# Patient Record
Sex: Male | Born: 1950 | Race: White | Hispanic: No | Marital: Married | Smoking: Never smoker
Health system: Southern US, Community
[De-identification: ages and names within clinical notes are randomized; demographics above are authoritative.]

## PROBLEM LIST (undated history)

## (undated) DIAGNOSIS — R931 Abnormal findings on diagnostic imaging of heart and coronary circulation: Secondary | ICD-10-CM

## (undated) DIAGNOSIS — I712 Thoracic aortic aneurysm, without rupture, unspecified: Secondary | ICD-10-CM

## (undated) DIAGNOSIS — G4730 Sleep apnea, unspecified: Secondary | ICD-10-CM

## (undated) DIAGNOSIS — I951 Orthostatic hypotension: Secondary | ICD-10-CM

## (undated) DIAGNOSIS — M3130 Wegener's granulomatosis without renal involvement: Secondary | ICD-10-CM

## (undated) DIAGNOSIS — I77810 Thoracic aortic ectasia: Secondary | ICD-10-CM

## (undated) DIAGNOSIS — M1990 Unspecified osteoarthritis, unspecified site: Secondary | ICD-10-CM

## (undated) DIAGNOSIS — I351 Nonrheumatic aortic (valve) insufficiency: Secondary | ICD-10-CM

## (undated) DIAGNOSIS — I700 Atherosclerosis of aorta: Secondary | ICD-10-CM

## (undated) DIAGNOSIS — I10 Essential (primary) hypertension: Secondary | ICD-10-CM

## (undated) DIAGNOSIS — E785 Hyperlipidemia, unspecified: Secondary | ICD-10-CM

## (undated) HISTORY — PX: HERNIA REPAIR: SHX51

## (undated) HISTORY — DX: Thoracic aortic aneurysm, without rupture, unspecified: I71.20

## (undated) HISTORY — PX: KNEE SURGERY: SHX244

## (undated) HISTORY — DX: Nonrheumatic aortic (valve) insufficiency: I35.1

## (undated) HISTORY — DX: Wegener's granulomatosis without renal involvement: M31.30

## (undated) HISTORY — DX: Hyperlipidemia, unspecified: E78.5

## (undated) HISTORY — DX: Abnormal findings on diagnostic imaging of heart and coronary circulation: R93.1

## (undated) HISTORY — PX: APPENDECTOMY: SHX54

## (undated) HISTORY — PX: EYE SURGERY: SHX253

## (undated) HISTORY — DX: Thoracic aortic ectasia: I77.810

## (undated) HISTORY — DX: Sleep apnea, unspecified: G47.30

## (undated) HISTORY — DX: Orthostatic hypotension: I95.1

---

## 1995-09-05 DIAGNOSIS — M3130 Wegener's granulomatosis without renal involvement: Secondary | ICD-10-CM

## 1995-09-05 HISTORY — PX: LUNG BIOPSY: SHX232

## 1995-09-05 HISTORY — DX: Wegener's granulomatosis without renal involvement: M31.30

## 1999-04-07 ENCOUNTER — Ambulatory Visit (HOSPITAL_BASED_OUTPATIENT_CLINIC_OR_DEPARTMENT_OTHER): Admission: RE | Admit: 1999-04-07 | Discharge: 1999-04-07 | Payer: Self-pay | Admitting: General Surgery

## 2000-01-21 ENCOUNTER — Emergency Department (HOSPITAL_COMMUNITY): Admission: EM | Admit: 2000-01-21 | Discharge: 2000-01-21 | Payer: Self-pay | Admitting: Emergency Medicine

## 2000-01-21 ENCOUNTER — Encounter: Payer: Self-pay | Admitting: Emergency Medicine

## 2002-09-02 ENCOUNTER — Encounter: Admission: RE | Admit: 2002-09-02 | Discharge: 2002-12-01 | Payer: Self-pay | Admitting: Family Medicine

## 2003-11-18 ENCOUNTER — Encounter: Admission: RE | Admit: 2003-11-18 | Discharge: 2004-02-16 | Payer: Self-pay | Admitting: Family Medicine

## 2004-02-22 ENCOUNTER — Ambulatory Visit (HOSPITAL_COMMUNITY): Admission: RE | Admit: 2004-02-22 | Discharge: 2004-02-22 | Payer: Self-pay | Admitting: Gastroenterology

## 2004-02-22 ENCOUNTER — Encounter (INDEPENDENT_AMBULATORY_CARE_PROVIDER_SITE_OTHER): Payer: Self-pay | Admitting: Specialist

## 2004-04-06 ENCOUNTER — Ambulatory Visit (HOSPITAL_COMMUNITY): Admission: RE | Admit: 2004-04-06 | Discharge: 2004-04-06 | Payer: Self-pay | Admitting: Gastroenterology

## 2004-09-04 HISTORY — PX: GASTRIC BYPASS: SHX52

## 2005-02-16 ENCOUNTER — Ambulatory Visit: Payer: Self-pay | Admitting: Internal Medicine

## 2005-03-20 ENCOUNTER — Encounter: Admission: RE | Admit: 2005-03-20 | Discharge: 2005-06-18 | Payer: Self-pay | Admitting: Surgery

## 2005-03-21 ENCOUNTER — Ambulatory Visit (HOSPITAL_COMMUNITY): Admission: RE | Admit: 2005-03-21 | Discharge: 2005-03-21 | Payer: Self-pay | Admitting: Surgery

## 2005-03-22 ENCOUNTER — Ambulatory Visit (HOSPITAL_COMMUNITY): Admission: RE | Admit: 2005-03-22 | Discharge: 2005-03-22 | Payer: Self-pay | Admitting: Surgery

## 2005-05-09 ENCOUNTER — Encounter (INDEPENDENT_AMBULATORY_CARE_PROVIDER_SITE_OTHER): Payer: Self-pay | Admitting: *Deleted

## 2005-05-09 ENCOUNTER — Inpatient Hospital Stay (HOSPITAL_COMMUNITY): Admission: RE | Admit: 2005-05-09 | Discharge: 2005-05-11 | Payer: Self-pay | Admitting: Surgery

## 2005-06-22 ENCOUNTER — Encounter: Admission: RE | Admit: 2005-06-22 | Discharge: 2005-09-20 | Payer: Self-pay | Admitting: Surgery

## 2005-08-08 ENCOUNTER — Ambulatory Visit (HOSPITAL_COMMUNITY): Admission: RE | Admit: 2005-08-08 | Discharge: 2005-08-08 | Payer: Self-pay | Admitting: Chiropractic Medicine

## 2005-09-27 ENCOUNTER — Encounter: Admission: RE | Admit: 2005-09-27 | Discharge: 2005-12-26 | Payer: Self-pay | Admitting: Surgery

## 2005-11-27 ENCOUNTER — Observation Stay (HOSPITAL_COMMUNITY): Admission: EM | Admit: 2005-11-27 | Discharge: 2005-11-28 | Payer: Self-pay | Admitting: Emergency Medicine

## 2005-11-27 ENCOUNTER — Encounter (INDEPENDENT_AMBULATORY_CARE_PROVIDER_SITE_OTHER): Payer: Self-pay | Admitting: *Deleted

## 2005-11-30 ENCOUNTER — Emergency Department (HOSPITAL_COMMUNITY): Admission: EM | Admit: 2005-11-30 | Discharge: 2005-12-01 | Payer: Self-pay | Admitting: Emergency Medicine

## 2005-12-09 ENCOUNTER — Emergency Department (HOSPITAL_COMMUNITY): Admission: EM | Admit: 2005-12-09 | Discharge: 2005-12-10 | Payer: Self-pay | Admitting: Emergency Medicine

## 2005-12-28 ENCOUNTER — Encounter: Admission: RE | Admit: 2005-12-28 | Discharge: 2005-12-28 | Payer: Self-pay | Admitting: Surgery

## 2006-05-09 ENCOUNTER — Encounter: Admission: RE | Admit: 2006-05-09 | Discharge: 2006-08-07 | Payer: Self-pay | Admitting: Surgery

## 2007-07-24 ENCOUNTER — Ambulatory Visit: Payer: Self-pay | Admitting: Oncology

## 2007-07-24 ENCOUNTER — Inpatient Hospital Stay (HOSPITAL_COMMUNITY): Admission: EM | Admit: 2007-07-24 | Discharge: 2007-07-26 | Payer: Self-pay | Admitting: Emergency Medicine

## 2007-07-30 ENCOUNTER — Ambulatory Visit: Payer: Self-pay | Admitting: Oncology

## 2007-07-31 ENCOUNTER — Encounter: Payer: Self-pay | Admitting: Internal Medicine

## 2007-07-31 ENCOUNTER — Encounter (HOSPITAL_COMMUNITY): Admission: RE | Admit: 2007-07-31 | Discharge: 2007-09-04 | Payer: Self-pay | Admitting: Oncology

## 2007-07-31 LAB — CBC WITH DIFFERENTIAL/PLATELET
BASO%: 0.2 % (ref 0.0–2.0)
HCT: 38.3 % — ABNORMAL LOW (ref 38.7–49.9)
MCHC: 35.3 g/dL (ref 32.0–35.9)
MONO#: 0.1 10*3/uL (ref 0.1–0.9)
NEUT%: 79.1 % — ABNORMAL HIGH (ref 40.0–75.0)
WBC: 4.8 10*3/uL (ref 4.0–10.0)
lymph#: 0.8 10*3/uL — ABNORMAL LOW (ref 0.9–3.3)

## 2007-08-02 LAB — CBC WITH DIFFERENTIAL/PLATELET
Basophils Absolute: 0 10*3/uL (ref 0.0–0.1)
Eosinophils Absolute: 0 10*3/uL (ref 0.0–0.5)
HCT: 38.2 % — ABNORMAL LOW (ref 38.7–49.9)
HGB: 13.9 g/dL (ref 13.0–17.1)
MCH: 32.3 pg (ref 28.0–33.4)
MONO#: 0.4 10*3/uL (ref 0.1–0.9)
NEUT#: 7 10*3/uL — ABNORMAL HIGH (ref 1.5–6.5)
NEUT%: 85 % — ABNORMAL HIGH (ref 40.0–75.0)
WBC: 8.2 10*3/uL (ref 4.0–10.0)
lymph#: 0.7 10*3/uL — ABNORMAL LOW (ref 0.9–3.3)

## 2007-08-04 ENCOUNTER — Inpatient Hospital Stay (HOSPITAL_COMMUNITY): Admission: AD | Admit: 2007-08-04 | Discharge: 2007-08-08 | Payer: Self-pay | Admitting: Oncology

## 2007-08-06 ENCOUNTER — Encounter (HOSPITAL_COMMUNITY): Payer: Self-pay | Admitting: Oncology

## 2007-08-09 LAB — CBC WITH DIFFERENTIAL/PLATELET
BASO%: 0.7 % (ref 0.0–2.0)
HGB: 12.9 g/dL — ABNORMAL LOW (ref 13.0–17.1)
LYMPH%: 6.9 % — ABNORMAL LOW (ref 14.0–48.0)
MCHC: UNDETERMINED g/dL (ref 32.0–35.9)
MONO#: 0.8 10*3/uL (ref 0.1–0.9)
RBC: UNDETERMINED 10*6/uL (ref 4.20–5.71)
RDW: 13.9 % (ref 11.2–14.6)
lymph#: 0.8 10*3/uL — ABNORMAL LOW (ref 0.9–3.3)

## 2007-08-12 LAB — CBC WITH DIFFERENTIAL/PLATELET
BASO%: 0.1 % (ref 0.0–2.0)
HCT: 35 % — ABNORMAL LOW (ref 38.7–49.9)
MCHC: 36.5 g/dL — ABNORMAL HIGH (ref 32.0–35.9)
MONO#: 0.3 10*3/uL (ref 0.1–0.9)
RBC: 3.83 10*6/uL — ABNORMAL LOW (ref 4.20–5.71)
RDW: 13.7 % (ref 11.2–14.6)
WBC: 5.5 10*3/uL (ref 4.0–10.0)
lymph#: 0.5 10*3/uL — ABNORMAL LOW (ref 0.9–3.3)

## 2007-08-12 LAB — COMPREHENSIVE METABOLIC PANEL
ALT: 31 U/L (ref 0–53)
CO2: 26 mEq/L (ref 19–32)
Calcium: 8.2 mg/dL — ABNORMAL LOW (ref 8.4–10.5)
Chloride: 99 mEq/L (ref 96–112)
Potassium: 4.4 mEq/L (ref 3.5–5.3)
Sodium: 133 mEq/L — ABNORMAL LOW (ref 135–145)
Total Protein: 7.3 g/dL (ref 6.0–8.3)

## 2007-08-12 LAB — LACTATE DEHYDROGENASE: LDH: 192 U/L (ref 94–250)

## 2007-08-13 ENCOUNTER — Encounter: Payer: Self-pay | Admitting: Internal Medicine

## 2007-08-13 LAB — CBC WITH DIFFERENTIAL/PLATELET
BASO%: 0.3 % (ref 0.0–2.0)
EOS%: 0.2 % (ref 0.0–7.0)
MCH: 33.1 pg (ref 28.0–33.4)
MCHC: 36.2 g/dL — ABNORMAL HIGH (ref 32.0–35.9)
MCV: 91.6 fL (ref 81.6–98.0)
MONO%: 0.9 % (ref 0.0–13.0)
NEUT%: 92.5 % — ABNORMAL HIGH (ref 40.0–75.0)
RDW: 14.4 % (ref 11.2–14.6)
lymph#: 0.4 10*3/uL — ABNORMAL LOW (ref 0.9–3.3)

## 2007-08-15 LAB — CBC WITH DIFFERENTIAL/PLATELET
Basophils Absolute: 0 10*3/uL (ref 0.0–0.1)
EOS%: 0.1 % (ref 0.0–7.0)
HGB: 12.2 g/dL — ABNORMAL LOW (ref 13.0–17.1)
MCH: 33 pg (ref 28.0–33.4)
MONO%: 3.4 % (ref 0.0–13.0)
NEUT#: 6.3 10*3/uL (ref 1.5–6.5)
RBC: 3.71 10*6/uL — ABNORMAL LOW (ref 4.20–5.71)
RDW: 14.7 % — ABNORMAL HIGH (ref 11.2–14.6)
lymph#: 0.4 10*3/uL — ABNORMAL LOW (ref 0.9–3.3)

## 2007-08-19 LAB — CBC WITH DIFFERENTIAL/PLATELET
Basophils Absolute: 0 10*3/uL (ref 0.0–0.1)
Eosinophils Absolute: 0 10*3/uL (ref 0.0–0.5)
HGB: 13.3 g/dL (ref 13.0–17.1)
MCV: UNDETERMINED fL (ref 81.6–98.0)
MONO#: 0.3 10*3/uL (ref 0.1–0.9)
NEUT#: 5.6 10*3/uL (ref 1.5–6.5)
RBC: UNDETERMINED 10*6/uL (ref 4.20–5.71)
RDW: UNDETERMINED % (ref 11.2–14.6)
WBC: 6.4 10*3/uL (ref 4.0–10.0)
lymph#: 0.5 10*3/uL — ABNORMAL LOW (ref 0.9–3.3)

## 2007-08-21 ENCOUNTER — Encounter: Payer: Self-pay | Admitting: Internal Medicine

## 2007-08-21 LAB — CBC WITH DIFFERENTIAL/PLATELET
BASO%: 0.4 % (ref 0.0–2.0)
Eosinophils Absolute: 0 10*3/uL (ref 0.0–0.5)
MCHC: 36.1 g/dL — ABNORMAL HIGH (ref 32.0–35.9)
MONO#: 0.1 10*3/uL (ref 0.1–0.9)
NEUT#: 5.8 10*3/uL (ref 1.5–6.5)
RBC: 4 10*6/uL — ABNORMAL LOW (ref 4.20–5.71)
RDW: 11.6 % (ref 11.2–14.6)
WBC: 6.4 10*3/uL (ref 4.0–10.0)
lymph#: 0.4 10*3/uL — ABNORMAL LOW (ref 0.9–3.3)

## 2007-08-21 LAB — COMPREHENSIVE METABOLIC PANEL
Albumin: 3.7 g/dL (ref 3.5–5.2)
Alkaline Phosphatase: 55 U/L (ref 39–117)
BUN: 27 mg/dL — ABNORMAL HIGH (ref 6–23)
CO2: 21 mEq/L (ref 19–32)
Glucose, Bld: 173 mg/dL — ABNORMAL HIGH (ref 70–99)
Potassium: 4.4 mEq/L (ref 3.5–5.3)
Sodium: 136 mEq/L (ref 135–145)
Total Protein: 6.7 g/dL (ref 6.0–8.3)

## 2007-08-21 LAB — LACTATE DEHYDROGENASE: LDH: 204 U/L (ref 94–250)

## 2007-08-23 LAB — CBC WITH DIFFERENTIAL/PLATELET
BASO%: 0.3 % (ref 0.0–2.0)
Basophils Absolute: 0 10*3/uL (ref 0.0–0.1)
Eosinophils Absolute: 0 10*3/uL (ref 0.0–0.5)
HCT: UNDETERMINED % (ref 38.7–49.9)
HGB: 13 g/dL (ref 13.0–17.1)
MCHC: UNDETERMINED g/dL (ref 32.0–35.9)
MONO#: 0.4 10*3/uL (ref 0.1–0.9)
NEUT#: 5.2 10*3/uL (ref 1.5–6.5)
NEUT%: 86.1 % — ABNORMAL HIGH (ref 40.0–75.0)
Platelets: 117 10*3/uL — ABNORMAL LOW (ref 145–400)
WBC: 6 10*3/uL (ref 4.0–10.0)
lymph#: 0.4 10*3/uL — ABNORMAL LOW (ref 0.9–3.3)

## 2007-08-27 LAB — CBC WITH DIFFERENTIAL/PLATELET
Basophils Absolute: 0.1 10*3/uL (ref 0.0–0.1)
EOS%: 0.5 % (ref 0.0–7.0)
HCT: 37.8 % — ABNORMAL LOW (ref 38.7–49.9)
HGB: 13.7 g/dL (ref 13.0–17.1)
LYMPH%: 24.1 % (ref 14.0–48.0)
MCH: 33.8 pg — ABNORMAL HIGH (ref 28.0–33.4)
MONO#: 0.4 10*3/uL (ref 0.1–0.9)
NEUT%: 67.4 % (ref 40.0–75.0)
Platelets: 137 10*3/uL — ABNORMAL LOW (ref 145–400)
lymph#: 1.6 10*3/uL (ref 0.9–3.3)

## 2007-08-29 ENCOUNTER — Emergency Department (HOSPITAL_COMMUNITY): Admission: EM | Admit: 2007-08-29 | Discharge: 2007-08-29 | Payer: Self-pay | Admitting: Emergency Medicine

## 2007-09-02 ENCOUNTER — Ambulatory Visit (HOSPITAL_COMMUNITY): Admission: RE | Admit: 2007-09-02 | Discharge: 2007-09-02 | Payer: Self-pay | Admitting: Emergency Medicine

## 2007-09-03 ENCOUNTER — Encounter: Payer: Self-pay | Admitting: Internal Medicine

## 2007-09-03 LAB — CBC WITH DIFFERENTIAL/PLATELET
Basophils Absolute: 0 10*3/uL (ref 0.0–0.1)
EOS%: 0 % (ref 0.0–7.0)
Eosinophils Absolute: 0 10*3/uL (ref 0.0–0.5)
HGB: 13.2 g/dL (ref 13.0–17.1)
MCH: 33.2 pg (ref 28.0–33.4)
MONO%: 1.5 % (ref 0.0–13.0)
NEUT#: 6.5 10*3/uL (ref 1.5–6.5)
RBC: 3.98 10*6/uL — ABNORMAL LOW (ref 4.20–5.71)
RDW: 14.8 % — ABNORMAL HIGH (ref 11.2–14.6)
lymph#: 0.4 10*3/uL — ABNORMAL LOW (ref 0.9–3.3)

## 2007-09-03 LAB — COMPREHENSIVE METABOLIC PANEL
ALT: 35 U/L (ref 0–53)
AST: 34 U/L (ref 0–37)
Albumin: 3.8 g/dL (ref 3.5–5.2)
Alkaline Phosphatase: 56 U/L (ref 39–117)
BUN: 22 mg/dL (ref 6–23)
Calcium: 8.9 mg/dL (ref 8.4–10.5)
Chloride: 106 mEq/L (ref 96–112)
Potassium: 4.6 mEq/L (ref 3.5–5.3)
Sodium: 141 mEq/L (ref 135–145)
Total Protein: 6.4 g/dL (ref 6.0–8.3)

## 2007-09-04 ENCOUNTER — Ambulatory Visit: Payer: Self-pay | Admitting: Oncology

## 2007-09-10 LAB — CBC WITH DIFFERENTIAL/PLATELET
BASO%: 0.7 % (ref 0.0–2.0)
EOS%: 0.4 % (ref 0.0–7.0)
Eosinophils Absolute: 0 10*3/uL (ref 0.0–0.5)
LYMPH%: 7.9 % — ABNORMAL LOW (ref 14.0–48.0)
MCH: 33.7 pg — ABNORMAL HIGH (ref 28.0–33.4)
MCHC: 36 g/dL — ABNORMAL HIGH (ref 32.0–35.9)
MCV: 93.7 fL (ref 81.6–98.0)
MONO%: 4.5 % (ref 0.0–13.0)
NEUT#: 6.7 10*3/uL — ABNORMAL HIGH (ref 1.5–6.5)
Platelets: 220 10*3/uL (ref 145–400)
RBC: 4.1 10*6/uL — ABNORMAL LOW (ref 4.20–5.71)
RDW: 14.6 % (ref 11.2–14.6)

## 2007-09-20 LAB — CBC WITH DIFFERENTIAL/PLATELET
BASO%: 0 % (ref 0.0–2.0)
Eosinophils Absolute: 0 10*3/uL (ref 0.0–0.5)
LYMPH%: 6.7 % — ABNORMAL LOW (ref 14.0–48.0)
MCHC: 35.3 g/dL (ref 32.0–35.9)
MONO#: 0.3 10*3/uL (ref 0.1–0.9)
NEUT#: 6.3 10*3/uL (ref 1.5–6.5)
Platelets: 267 10*3/uL (ref 145–400)
RBC: 4.23 10*6/uL (ref 4.20–5.71)
RDW: 13.9 % (ref 11.2–14.6)
WBC: 7 10*3/uL (ref 4.0–10.0)

## 2007-09-20 LAB — BASIC METABOLIC PANEL
CO2: 26 mEq/L (ref 19–32)
Calcium: 9.1 mg/dL (ref 8.4–10.5)
Potassium: 4.7 mEq/L (ref 3.5–5.3)
Sodium: 140 mEq/L (ref 135–145)

## 2007-09-25 LAB — CBC WITH DIFFERENTIAL/PLATELET
BASO%: 0.2 % (ref 0.0–2.0)
Basophils Absolute: 0 10*3/uL (ref 0.0–0.1)
EOS%: 0.1 % (ref 0.0–7.0)
HGB: 14.2 g/dL (ref 13.0–17.1)
MCH: 33.6 pg — ABNORMAL HIGH (ref 28.0–33.4)
MCHC: 36.3 g/dL — ABNORMAL HIGH (ref 32.0–35.9)
MCV: 92.5 fL (ref 81.6–98.0)
MONO%: 2.2 % (ref 0.0–13.0)
RBC: 4.22 10*6/uL (ref 4.20–5.71)
RDW: 13.6 % (ref 11.2–14.6)
lymph#: 0.5 10*3/uL — ABNORMAL LOW (ref 0.9–3.3)

## 2007-09-27 LAB — CBC WITH DIFFERENTIAL/PLATELET
EOS%: 0.9 % (ref 0.0–7.0)
Eosinophils Absolute: 0.1 10*3/uL (ref 0.0–0.5)
HGB: 14.4 g/dL (ref 13.0–17.1)
MCH: UNDETERMINED pg (ref 28.0–33.4)
MCV: UNDETERMINED fL (ref 81.6–98.0)
MONO%: 6.8 % (ref 0.0–13.0)
NEUT#: 4.6 10*3/uL (ref 1.5–6.5)
RBC: UNDETERMINED 10*6/uL (ref 4.20–5.71)
RDW: UNDETERMINED % (ref 11.2–14.6)
lymph#: 1.4 10*3/uL (ref 0.9–3.3)

## 2007-10-02 LAB — CBC WITH DIFFERENTIAL/PLATELET
Basophils Absolute: 0 10*3/uL (ref 0.0–0.1)
Eosinophils Absolute: 0 10*3/uL (ref 0.0–0.5)
HGB: 14 g/dL (ref 13.0–17.1)
MONO#: 0.3 10*3/uL (ref 0.1–0.9)
NEUT#: 7.2 10*3/uL — ABNORMAL HIGH (ref 1.5–6.5)
RBC: 4.24 10*6/uL (ref 4.20–5.71)
RDW: 13.8 % (ref 11.2–14.6)
WBC: 8.2 10*3/uL (ref 4.0–10.0)

## 2007-10-09 LAB — CBC WITH DIFFERENTIAL/PLATELET
BASO%: 0.5 % (ref 0.0–2.0)
HCT: 39.2 % (ref 38.7–49.9)
LYMPH%: 12.3 % — ABNORMAL LOW (ref 14.0–48.0)
MCH: 33.2 pg (ref 28.0–33.4)
MCHC: 36.2 g/dL — ABNORMAL HIGH (ref 32.0–35.9)
MCV: 91.6 fL (ref 81.6–98.0)
MONO%: 5.4 % (ref 0.0–13.0)
NEUT%: 81.7 % — ABNORMAL HIGH (ref 40.0–75.0)
Platelets: 213 10*3/uL (ref 145–400)
RBC: 4.28 10*6/uL (ref 4.20–5.71)

## 2007-10-16 ENCOUNTER — Ambulatory Visit: Payer: Self-pay | Admitting: Oncology

## 2007-10-18 LAB — COMPREHENSIVE METABOLIC PANEL
ALT: 24 U/L (ref 0–53)
AST: 31 U/L (ref 0–37)
CO2: 26 mEq/L (ref 19–32)
Chloride: 103 mEq/L (ref 96–112)
Creatinine, Ser: 0.72 mg/dL (ref 0.40–1.50)
Sodium: 140 mEq/L (ref 135–145)
Total Bilirubin: 0.7 mg/dL (ref 0.3–1.2)
Total Protein: 6.4 g/dL (ref 6.0–8.3)

## 2007-10-18 LAB — CBC WITH DIFFERENTIAL/PLATELET
BASO%: 0.4 % (ref 0.0–2.0)
EOS%: 0 % (ref 0.0–7.0)
HCT: 42.1 % (ref 38.7–49.9)
LYMPH%: 9 % — ABNORMAL LOW (ref 14.0–48.0)
MCH: 30.7 pg (ref 28.0–33.4)
MCHC: 33.5 g/dL (ref 32.0–35.9)
MONO#: 0.3 10*3/uL (ref 0.1–0.9)
NEUT%: 86.6 % — ABNORMAL HIGH (ref 40.0–75.0)
RBC: 4.58 10*6/uL (ref 4.20–5.71)
WBC: 6.4 10*3/uL (ref 4.0–10.0)
lymph#: 0.6 10*3/uL — ABNORMAL LOW (ref 0.9–3.3)

## 2007-10-18 LAB — LACTATE DEHYDROGENASE: LDH: 236 U/L (ref 94–250)

## 2007-10-29 LAB — CBC WITH DIFFERENTIAL/PLATELET
BASO%: 0.6 % (ref 0.0–2.0)
LYMPH%: 13.7 % — ABNORMAL LOW (ref 14.0–48.0)
MCHC: 36.4 g/dL — ABNORMAL HIGH (ref 32.0–35.9)
MONO#: 0.5 10*3/uL (ref 0.1–0.9)
RBC: 4.32 10*6/uL (ref 4.20–5.71)
WBC: 6.9 10*3/uL (ref 4.0–10.0)
lymph#: 0.9 10*3/uL (ref 0.9–3.3)

## 2007-11-14 LAB — CBC WITH DIFFERENTIAL/PLATELET
Basophils Absolute: 0 10*3/uL (ref 0.0–0.1)
Eosinophils Absolute: 0 10*3/uL (ref 0.0–0.5)
HGB: 14.1 g/dL (ref 13.0–17.1)
MCV: 89.7 fL (ref 81.6–98.0)
MONO#: 0.4 10*3/uL (ref 0.1–0.9)
MONO%: 6.3 % (ref 0.0–13.0)
NEUT#: 4.9 10*3/uL (ref 1.5–6.5)
Platelets: 165 10*3/uL (ref 145–400)
RDW: 12.9 % (ref 11.2–14.6)
WBC: 6.4 10*3/uL (ref 4.0–10.0)

## 2007-11-26 ENCOUNTER — Ambulatory Visit: Payer: Self-pay | Admitting: Oncology

## 2007-12-02 LAB — CBC WITH DIFFERENTIAL/PLATELET
Basophils Absolute: 0 10*3/uL (ref 0.0–0.1)
Eosinophils Absolute: 0 10*3/uL (ref 0.0–0.5)
HCT: 41.5 % (ref 38.7–49.9)
LYMPH%: 16.2 % (ref 14.0–48.0)
MCV: 89.6 fL (ref 81.6–98.0)
MONO#: 0.4 10*3/uL (ref 0.1–0.9)
MONO%: 9.9 % (ref 0.0–13.0)
NEUT#: 2.9 10*3/uL (ref 1.5–6.5)
NEUT%: 72.6 % (ref 40.0–75.0)
Platelets: 194 10*3/uL (ref 145–400)
RBC: 4.63 10*6/uL (ref 4.20–5.71)

## 2007-12-16 LAB — CBC WITH DIFFERENTIAL/PLATELET
BASO%: 1.8 % (ref 0.0–2.0)
Basophils Absolute: 0.1 10*3/uL (ref 0.0–0.1)
EOS%: 3.3 % (ref 0.0–7.0)
Eosinophils Absolute: 0.2 10*3/uL (ref 0.0–0.5)
HCT: 42.8 % (ref 38.7–49.9)
HGB: 15.5 g/dL (ref 13.0–17.1)
LYMPH%: 38.5 % (ref 14.0–48.0)
MCH: 31.5 pg (ref 28.0–33.4)
MCHC: 36.2 g/dL — ABNORMAL HIGH (ref 32.0–35.9)
MCV: 87 fL (ref 81.6–98.0)
MONO#: 0.5 10*3/uL (ref 0.1–0.9)
MONO%: 9.3 % (ref 0.0–13.0)
NEUT#: 2.6 10*3/uL (ref 1.5–6.5)
NEUT%: 47.2 % (ref 40.0–75.0)
Platelets: 208 10*3/uL (ref 145–400)
RBC: 4.92 10*6/uL (ref 4.20–5.71)
RDW: 11.6 % (ref 11.2–14.6)
WBC: 5.6 10*3/uL (ref 4.0–10.0)
lymph#: 2.1 10*3/uL (ref 0.9–3.3)

## 2008-01-01 LAB — CBC WITH DIFFERENTIAL/PLATELET
Basophils Absolute: 0.1 10*3/uL (ref 0.0–0.1)
EOS%: 0.5 % (ref 0.0–7.0)
Eosinophils Absolute: 0 10*3/uL (ref 0.0–0.5)
HGB: 14.5 g/dL (ref 13.0–17.1)
NEUT#: 5.8 10*3/uL (ref 1.5–6.5)
RDW: 11.7 % (ref 11.2–14.6)
lymph#: 1.1 10*3/uL (ref 0.9–3.3)

## 2008-01-10 ENCOUNTER — Ambulatory Visit: Payer: Self-pay | Admitting: Oncology

## 2008-01-14 ENCOUNTER — Encounter: Payer: Self-pay | Admitting: Internal Medicine

## 2008-01-14 LAB — CBC WITH DIFFERENTIAL/PLATELET
BASO%: 0.8 % (ref 0.0–2.0)
Eosinophils Absolute: 0 10*3/uL (ref 0.0–0.5)
HCT: 41.3 % (ref 38.7–49.9)
HGB: 14.7 g/dL (ref 13.0–17.1)
LYMPH%: 11.8 % — ABNORMAL LOW (ref 14.0–48.0)
MCHC: 35.5 g/dL (ref 32.0–35.9)
MONO#: 0.7 10*3/uL (ref 0.1–0.9)
NEUT#: 5.6 10*3/uL (ref 1.5–6.5)
NEUT%: 77.7 % — ABNORMAL HIGH (ref 40.0–75.0)
Platelets: 206 10*3/uL (ref 145–400)
WBC: 7.2 10*3/uL (ref 4.0–10.0)
lymph#: 0.9 10*3/uL (ref 0.9–3.3)

## 2008-02-11 LAB — CBC WITH DIFFERENTIAL/PLATELET
Eosinophils Absolute: 0 10*3/uL (ref 0.0–0.5)
HCT: 38 % — ABNORMAL LOW (ref 38.7–49.9)
LYMPH%: 20.9 % (ref 14.0–48.0)
MCHC: 35.8 g/dL (ref 32.0–35.9)
MONO#: 0.5 10*3/uL (ref 0.1–0.9)
NEUT#: 3.7 10*3/uL (ref 1.5–6.5)
NEUT%: 68.8 % (ref 40.0–75.0)
Platelets: 198 10*3/uL (ref 145–400)
WBC: 5.3 10*3/uL (ref 4.0–10.0)

## 2008-02-20 LAB — CBC WITH DIFFERENTIAL/PLATELET
Eosinophils Absolute: 0.1 10*3/uL (ref 0.0–0.5)
HCT: 41.4 % (ref 38.7–49.9)
LYMPH%: 29.2 % (ref 14.0–48.0)
MCV: 87.8 fL (ref 81.6–98.0)
MONO#: 0.6 10*3/uL (ref 0.1–0.9)
MONO%: 10.2 % (ref 0.0–13.0)
NEUT#: 3.2 10*3/uL (ref 1.5–6.5)
NEUT%: 58.3 % (ref 40.0–75.0)
Platelets: 196 10*3/uL (ref 145–400)
RBC: 4.71 10*6/uL (ref 4.20–5.71)
WBC: 5.5 10*3/uL (ref 4.0–10.0)

## 2008-02-20 LAB — COMPREHENSIVE METABOLIC PANEL
Alkaline Phosphatase: 87 U/L (ref 39–117)
BUN: 19 mg/dL (ref 6–23)
CO2: 32 mEq/L (ref 19–32)
Creatinine, Ser: 0.88 mg/dL (ref 0.40–1.50)
Glucose, Bld: 69 mg/dL — ABNORMAL LOW (ref 70–99)
Sodium: 142 mEq/L (ref 135–145)
Total Bilirubin: 1.1 mg/dL (ref 0.3–1.2)

## 2008-02-20 LAB — LACTATE DEHYDROGENASE: LDH: 166 U/L (ref 94–250)

## 2008-03-12 ENCOUNTER — Ambulatory Visit: Payer: Self-pay | Admitting: Oncology

## 2008-03-12 LAB — CBC WITH DIFFERENTIAL/PLATELET
Eosinophils Absolute: 0.2 10*3/uL (ref 0.0–0.5)
HGB: 14.1 g/dL (ref 13.0–17.1)
LYMPH%: 29.6 % (ref 14.0–48.0)
MONO#: 0.4 10*3/uL (ref 0.1–0.9)
NEUT#: 3.2 10*3/uL (ref 1.5–6.5)
Platelets: 192 10*3/uL (ref 145–400)
RBC: 4.42 10*6/uL (ref 4.20–5.71)
WBC: 5.4 10*3/uL (ref 4.0–10.0)

## 2008-04-07 LAB — CBC WITH DIFFERENTIAL/PLATELET
BASO%: 0.5 % (ref 0.0–2.0)
EOS%: 0.9 % (ref 0.0–7.0)
MCH: 31.8 pg (ref 28.0–33.4)
MCV: 89 fL (ref 81.6–98.0)
MONO%: 4 % (ref 0.0–13.0)
RBC: 4.26 10*6/uL (ref 4.20–5.71)
RDW: 13.5 % (ref 11.2–14.6)
lymph#: 0.9 10*3/uL (ref 0.9–3.3)

## 2008-04-24 ENCOUNTER — Ambulatory Visit: Payer: Self-pay | Admitting: Oncology

## 2008-04-28 LAB — CBC WITH DIFFERENTIAL/PLATELET
Eosinophils Absolute: 0.1 10*3/uL (ref 0.0–0.5)
MONO#: 0.6 10*3/uL (ref 0.1–0.9)
MONO%: 9.1 % (ref 0.0–13.0)
NEUT#: 4.5 10*3/uL (ref 1.5–6.5)
RBC: 4.32 10*6/uL (ref 4.20–5.71)
RDW: 11.8 % (ref 11.2–14.6)
WBC: 6.6 10*3/uL (ref 4.0–10.0)
lymph#: 1.3 10*3/uL (ref 0.9–3.3)

## 2008-05-29 ENCOUNTER — Encounter: Payer: Self-pay | Admitting: Internal Medicine

## 2008-05-29 LAB — CBC WITH DIFFERENTIAL/PLATELET
BASO%: 1.1 % (ref 0.0–2.0)
Basophils Absolute: 0.1 10*3/uL (ref 0.0–0.1)
Eosinophils Absolute: 0.1 10*3/uL (ref 0.0–0.5)
HCT: 38 % — ABNORMAL LOW (ref 38.7–49.9)
HGB: 13.4 g/dL (ref 13.0–17.1)
MONO#: 0.4 10*3/uL (ref 0.1–0.9)
NEUT#: 2.7 10*3/uL (ref 1.5–6.5)
NEUT%: 59.7 % (ref 40.0–75.0)
WBC: 4.6 10*3/uL (ref 4.0–10.0)
lymph#: 1.2 10*3/uL (ref 0.9–3.3)

## 2008-05-29 LAB — COMPREHENSIVE METABOLIC PANEL
ALT: 19 U/L (ref 0–53)
AST: 32 U/L (ref 0–37)
Albumin: 4.3 g/dL (ref 3.5–5.2)
BUN: 18 mg/dL (ref 6–23)
CO2: 29 mEq/L (ref 19–32)
Calcium: 8.8 mg/dL (ref 8.4–10.5)
Chloride: 103 mEq/L (ref 96–112)
Potassium: 4.2 mEq/L (ref 3.5–5.3)

## 2008-05-29 LAB — LACTATE DEHYDROGENASE: LDH: 187 U/L (ref 94–250)

## 2008-06-16 ENCOUNTER — Ambulatory Visit: Payer: Self-pay | Admitting: Oncology

## 2008-06-19 LAB — CBC WITH DIFFERENTIAL/PLATELET
BASO%: 1.6 % (ref 0.0–2.0)
Eosinophils Absolute: 0.2 10*3/uL (ref 0.0–0.5)
HCT: 36.5 % — ABNORMAL LOW (ref 38.7–49.9)
MCHC: 36.6 g/dL — ABNORMAL HIGH (ref 32.0–35.9)
MONO#: 0.6 10*3/uL (ref 0.1–0.9)
NEUT#: 2.7 10*3/uL (ref 1.5–6.5)
RBC: 4.31 10*6/uL (ref 4.20–5.71)
WBC: 5 10*3/uL (ref 4.0–10.0)
lymph#: 1.5 10*3/uL (ref 0.9–3.3)

## 2008-07-23 LAB — CBC WITH DIFFERENTIAL/PLATELET
BASO%: 1.3 % (ref 0.0–2.0)
LYMPH%: 36.2 % (ref 14.0–48.0)
MCHC: 35.8 g/dL (ref 32.0–35.9)
MONO#: 0.5 10*3/uL (ref 0.1–0.9)
MONO%: 11 % (ref 0.0–13.0)
NEUT#: 2.1 10*3/uL (ref 1.5–6.5)
Platelets: 176 10*3/uL (ref 145–400)
RBC: 4.54 10*6/uL (ref 4.20–5.71)
RDW: 11.1 % — ABNORMAL LOW (ref 11.2–14.6)
WBC: 4.4 10*3/uL (ref 4.0–10.0)

## 2008-08-18 ENCOUNTER — Ambulatory Visit: Payer: Self-pay | Admitting: Oncology

## 2008-09-24 ENCOUNTER — Ambulatory Visit: Payer: Self-pay | Admitting: Oncology

## 2008-10-05 LAB — CBC WITH DIFFERENTIAL/PLATELET
BASO%: 0.6 % (ref 0.0–2.0)
HCT: 40.7 % (ref 38.7–49.9)
LYMPH%: 53.6 % — ABNORMAL HIGH (ref 14.0–48.0)
MCH: 30.2 pg (ref 28.0–33.4)
MCHC: 34.1 g/dL (ref 32.0–35.9)
MCV: 88.6 fL (ref 81.6–98.0)
MONO#: 0.9 10*3/uL (ref 0.1–0.9)
MONO%: 11 % (ref 0.0–13.0)
NEUT%: 32.6 % — ABNORMAL LOW (ref 40.0–75.0)
Platelets: 188 10*3/uL (ref 145–400)
RBC: 4.59 10*6/uL (ref 4.20–5.71)

## 2008-10-12 LAB — CBC WITH DIFFERENTIAL/PLATELET
BASO%: 1.1 % (ref 0.0–2.0)
EOS%: 4.7 % (ref 0.0–7.0)
HCT: 36.3 % — ABNORMAL LOW (ref 38.7–49.9)
LYMPH%: 46.9 % (ref 14.0–48.0)
MCH: 30.8 pg (ref 28.0–33.4)
MCHC: 34.9 g/dL (ref 32.0–35.9)
MONO#: 0.6 10*3/uL (ref 0.1–0.9)
NEUT%: 34.5 % — ABNORMAL LOW (ref 40.0–75.0)
RBC: 4.12 10*6/uL — ABNORMAL LOW (ref 4.20–5.71)
WBC: 5 10*3/uL (ref 4.0–10.0)
lymph#: 2.3 10*3/uL (ref 0.9–3.3)

## 2008-10-19 LAB — CBC WITH DIFFERENTIAL/PLATELET
BASO%: 1.4 % (ref 0.0–2.0)
Basophils Absolute: 0.1 10*3/uL (ref 0.0–0.1)
EOS%: 5.1 % (ref 0.0–7.0)
HCT: 36.1 % — ABNORMAL LOW (ref 38.7–49.9)
HGB: 12.7 g/dL — ABNORMAL LOW (ref 13.0–17.1)
LYMPH%: 46.1 % (ref 14.0–48.0)
MCH: 30.9 pg (ref 28.0–33.4)
MCHC: 35.2 g/dL (ref 32.0–35.9)
MCV: 87.9 fL (ref 81.6–98.0)
MONO%: 13.6 % — ABNORMAL HIGH (ref 0.0–13.0)
NEUT%: 33.8 % — ABNORMAL LOW (ref 40.0–75.0)
Platelets: 211 10*3/uL (ref 145–400)
lymph#: 2.5 10*3/uL (ref 0.9–3.3)

## 2008-10-26 LAB — CBC WITH DIFFERENTIAL/PLATELET
BASO%: 1.4 % (ref 0.0–2.0)
Basophils Absolute: 0.1 10*3/uL (ref 0.0–0.1)
EOS%: 4.8 % (ref 0.0–7.0)
HGB: 13 g/dL (ref 13.0–17.1)
MCH: 29.5 pg (ref 27.2–33.4)
MCHC: 34.6 g/dL (ref 32.0–36.0)
MCV: 85.5 fL (ref 79.3–98.0)
MONO%: 14 % (ref 0.0–14.0)
RBC: 4.4 10*6/uL (ref 4.20–5.82)
RDW: 13.5 % (ref 11.0–14.6)
lymph#: 2.2 10*3/uL (ref 0.9–3.3)

## 2008-10-27 ENCOUNTER — Encounter: Admission: RE | Admit: 2008-10-27 | Discharge: 2008-10-27 | Payer: Self-pay | Admitting: Surgery

## 2008-10-29 IMAGING — CT CT ABDOMEN W/ CM
2 of 5 series · 17 of 46 positions shown, 19 images · IV contrast (omnipaque)
Comparison: Noncontrast abdominal and pelvic CT(s) 12/01/05.

CLINICAL DATA: Thrombocytopenia with bruising in multiple areas status post fall.  
 ABDOMEN CT WITH CONTRAST:
TECHNIQUE: Multidetector CT imaging of the abdomen was performed following the standard protocol during bolus administration of intravenous contrast.
 Contrast:  100 cc Omnipaque 300.  Oral contrast was given.
TECHNIQUE: Multidetector CT imaging of the pelvis was performed following the standard protocol during bolus administration of intravenous contrast.

[Series 2: abd_pel 5.0 b40f st · axial · 0.74mm/px · z∈[-198,+188]mm · 14 of 87 slices shown, 16 images]
[im 5/87  soft-tissue]
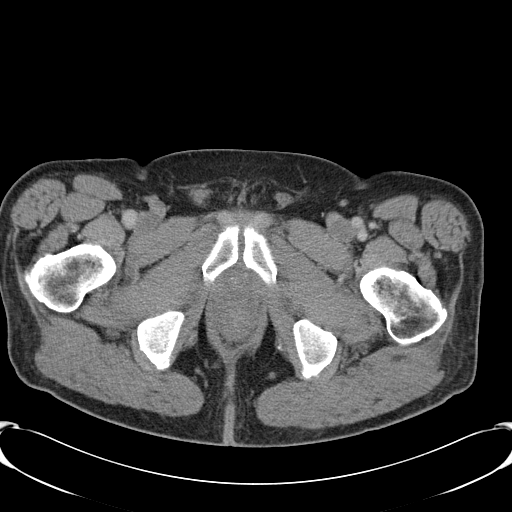
[im 5/87  bone]
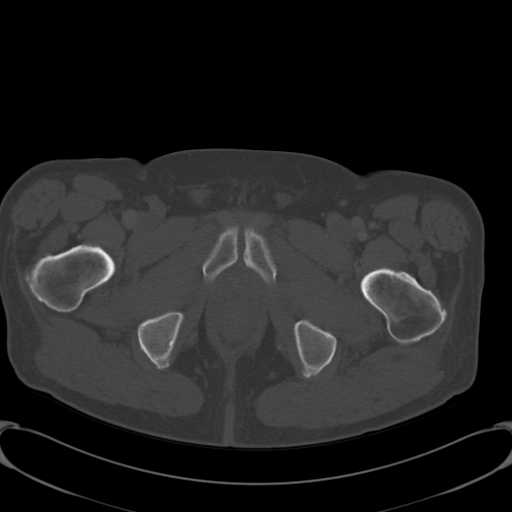
[im 10/87  soft-tissue]
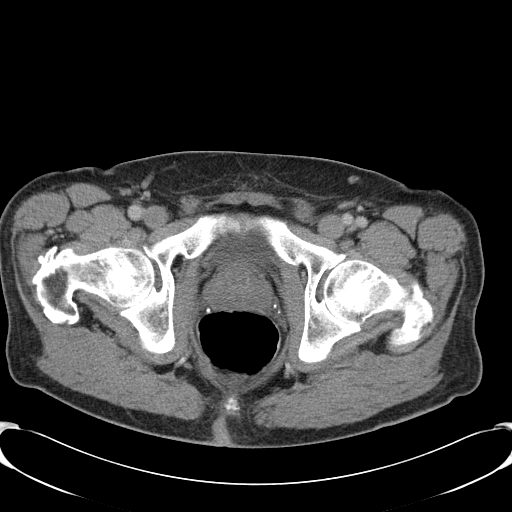
[im 19/87  soft-tissue]
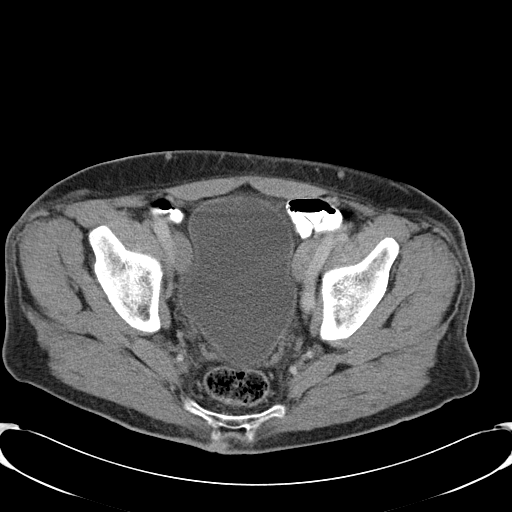
[im 23/87  soft-tissue]
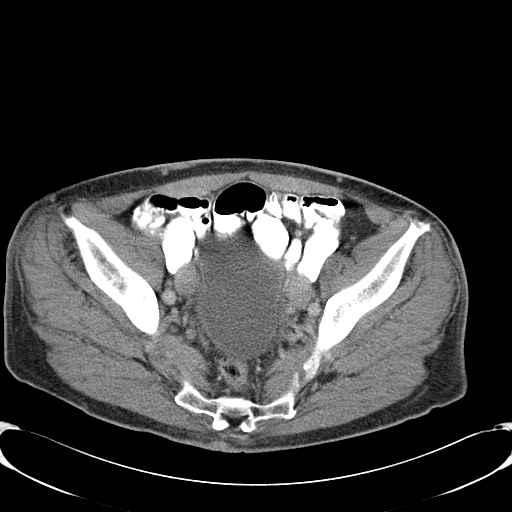
[im 28/87  soft-tissue]
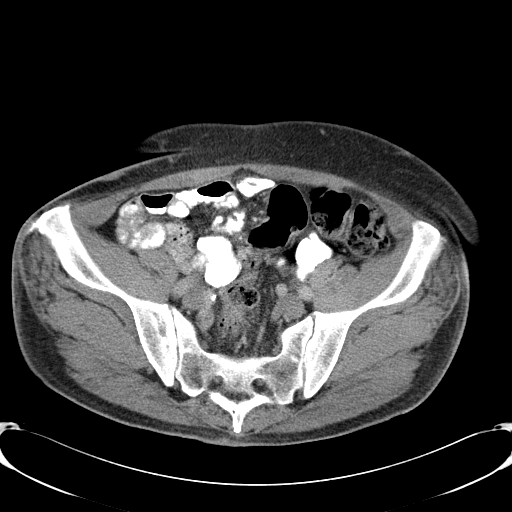
[im 37/87  soft-tissue]
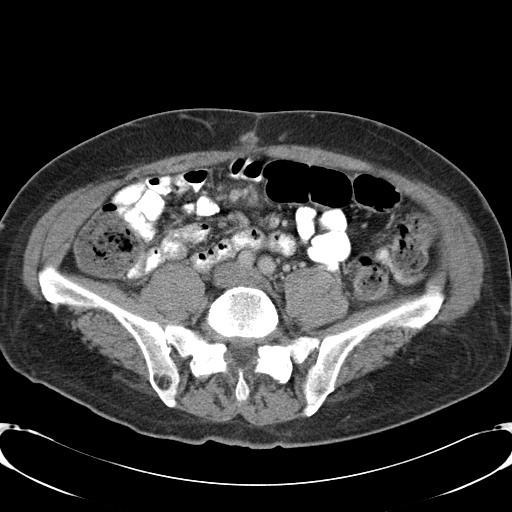
[im 41/87  soft-tissue]
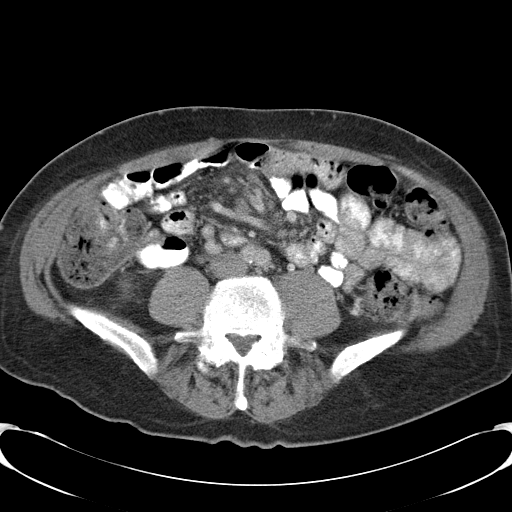
[im 46/87  soft-tissue]
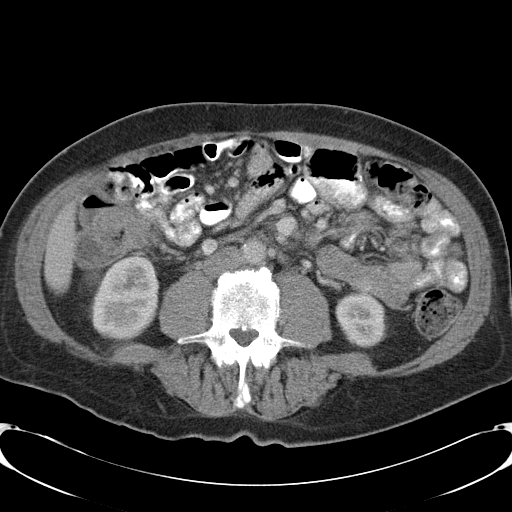
[im 50/87  soft-tissue]
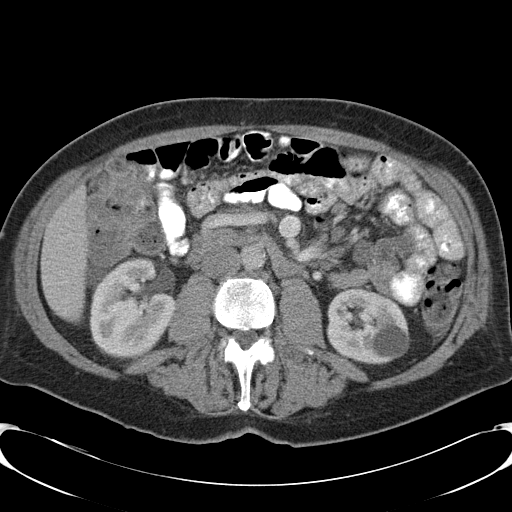
[im 50/87  bone]
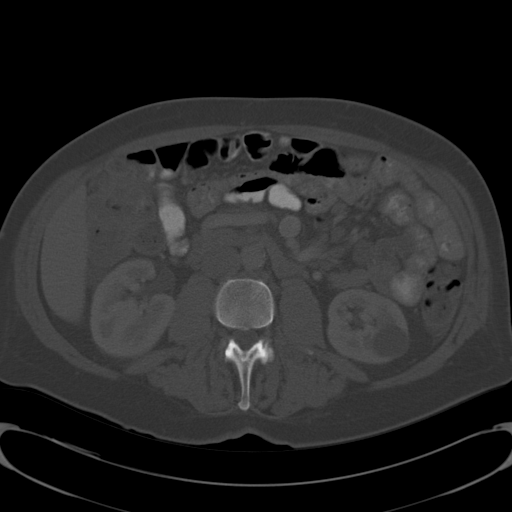
[im 59/87  soft-tissue]
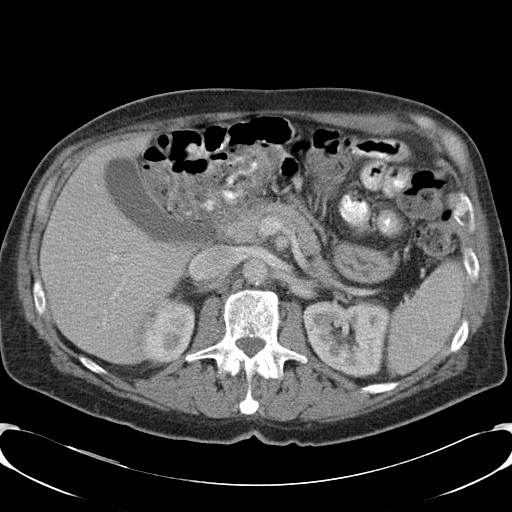
[im 64/87  soft-tissue]
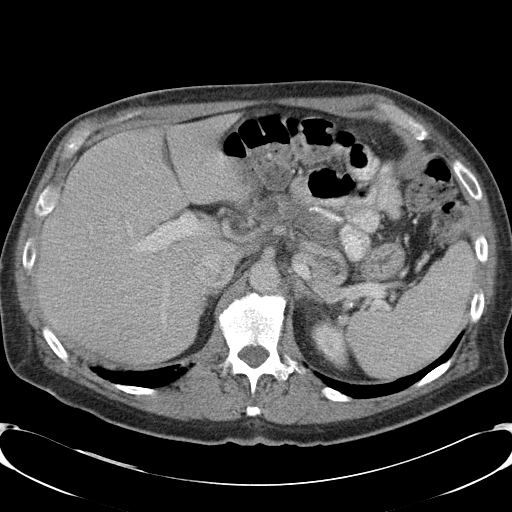
[im 68/87  soft-tissue]
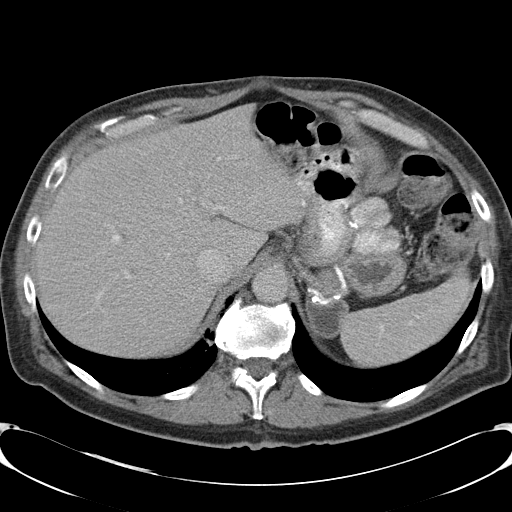
[im 77/87  soft-tissue]
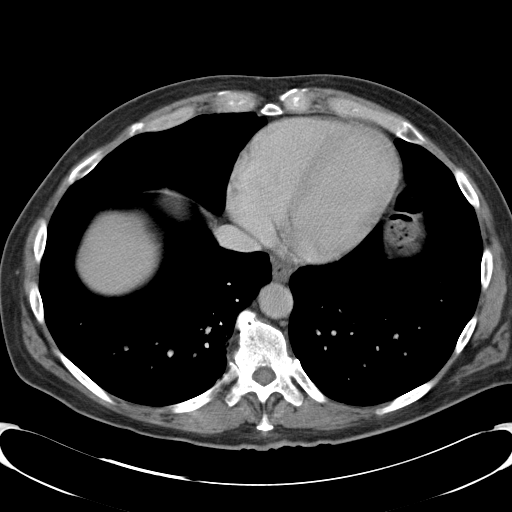
[im 82/87  soft-tissue]
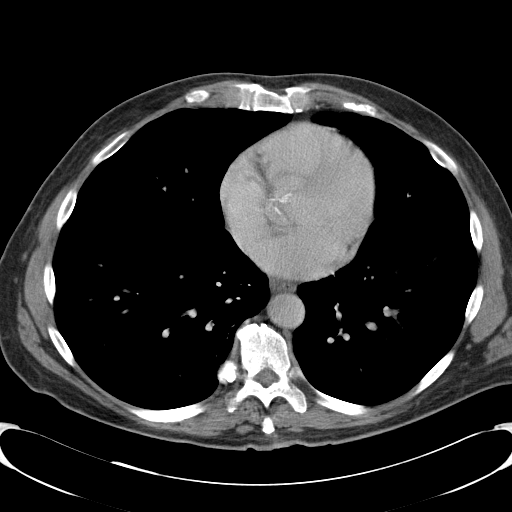

[Series 602: <mpr thick range> · coronal · 0.88mm/px · 3 of 82 slices shown]
[im 28/82  soft-tissue]
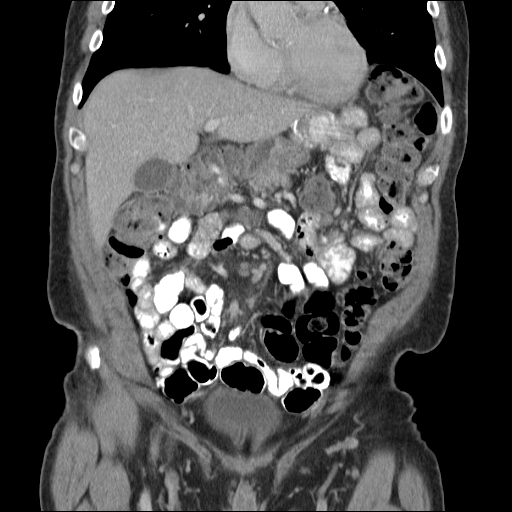
[im 37/82  soft-tissue]
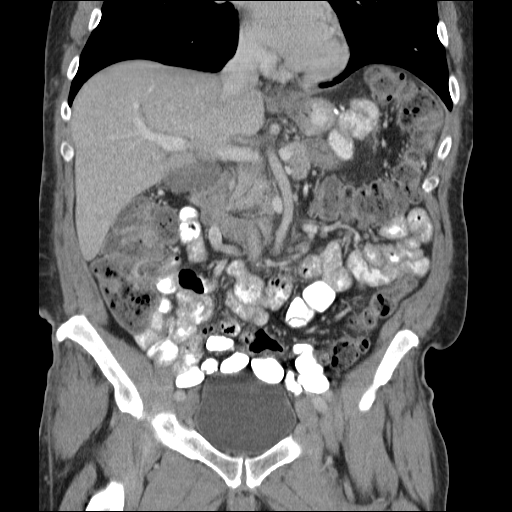
[im 46/82  soft-tissue]
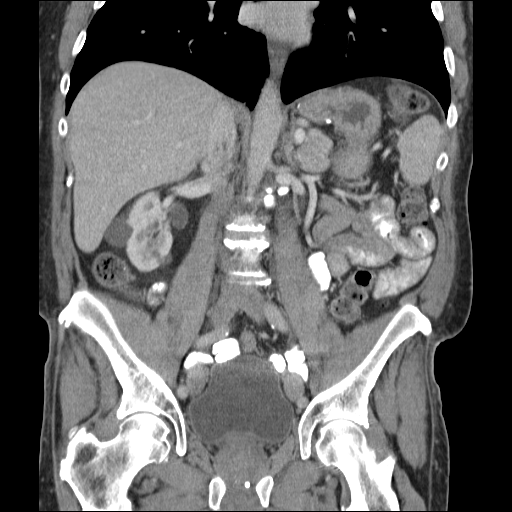

[17 of 46 positions shown; findings below may reference images not displayed]

FINDINGS: There is stable scarring medially at the right lung base adjacent to prominent osteophytes at the costovertebral junction.  The lung bases are otherwise clear.  There are stable postsurgical changes in the upper abdomen related to gastric bypass procedure.  There is no evidence of bowel wall thickening or hemorrhage.  There is no ascites or extraluminal fluid collection.  The vascular structures enhance normally.  
 The spleen is normal in size without focal abnormality.  The liver, gallbladder and pancreas appear normal.  The adrenal glands appear normal.  Bilateral renal cysts are present.  The right-sided hydronephrosis noted previously has resolved.  Colonic evaluation is limited by incomplete distention and moderate stool.
IMPRESSION: 1.  No evidence of acute intraabdominal hemorrhage.  
 2.  No explanation for thrombocytopenia demonstrated.  The spleen is normal in size. 
 3.  Stable postsurgical changes related to gastric bypass procedure.  Bilateral renal cysts.  
 PELVIS CT WITH CONTRAST:
FINDINGS: There is no pelvic mass, fluid collection or inflammatory process.  Mild enlargement of the prostate gland is noted.  There is no evidence of pelvic hematoma or acute fracture.
IMPRESSION: No acute pelvic findings.

## 2008-11-02 LAB — CBC WITH DIFFERENTIAL/PLATELET
BASO%: 1.3 % (ref 0.0–2.0)
Basophils Absolute: 0.1 10*3/uL (ref 0.0–0.1)
EOS%: 5.3 % (ref 0.0–7.0)
Eosinophils Absolute: 0.4 10*3/uL (ref 0.0–0.5)
HCT: 37.6 % — ABNORMAL LOW (ref 38.4–49.9)
HGB: 13.1 g/dL (ref 13.0–17.1)
LYMPH%: 38.4 % (ref 14.0–49.0)
MCH: 29.8 pg (ref 27.2–33.4)
MCHC: 34.8 g/dL (ref 32.0–36.0)
MCV: 85.5 fL (ref 79.3–98.0)
MONO#: 0.9 10*3/uL (ref 0.1–0.9)
MONO%: 12.4 % (ref 0.0–14.0)
NEUT#: 3 10*3/uL (ref 1.5–6.5)
NEUT%: 42.6 % (ref 39.0–75.0)
Platelets: 158 10*3/uL (ref 140–400)
RBC: 4.4 10*6/uL (ref 4.20–5.82)
RDW: 13.5 % (ref 11.0–14.6)
WBC: 6.9 10*3/uL (ref 4.0–10.3)
lymph#: 2.7 10*3/uL (ref 0.9–3.3)
nRBC: 0 % (ref 0–0)

## 2008-11-09 ENCOUNTER — Ambulatory Visit: Payer: Self-pay | Admitting: Oncology

## 2008-11-09 LAB — CBC WITH DIFFERENTIAL/PLATELET
BASO%: 0.8 % (ref 0.0–2.0)
Basophils Absolute: 0.1 10*3/uL (ref 0.0–0.1)
HCT: 36.6 % — ABNORMAL LOW (ref 38.4–49.9)
HGB: 12.3 g/dL — ABNORMAL LOW (ref 13.0–17.1)
LYMPH%: 32.8 % (ref 14.0–49.0)
MCH: 29.4 pg (ref 27.2–33.4)
MCHC: 33.6 g/dL (ref 32.0–36.0)
MONO#: 0.6 10*3/uL (ref 0.1–0.9)
NEUT%: 51.6 % (ref 39.0–75.0)
Platelets: 168 10*3/uL (ref 140–400)
WBC: 6 10*3/uL (ref 4.0–10.3)

## 2008-11-16 LAB — CBC WITH DIFFERENTIAL/PLATELET
Basophils Absolute: 0.1 10*3/uL (ref 0.0–0.1)
EOS%: 4.5 % (ref 0.0–7.0)
Eosinophils Absolute: 0.3 10*3/uL (ref 0.0–0.5)
HGB: 12.3 g/dL — ABNORMAL LOW (ref 13.0–17.1)
LYMPH%: 32.3 % (ref 14.0–49.0)
MCH: 29.9 pg (ref 27.2–33.4)
MCV: 87.9 fL (ref 79.3–98.0)
MONO%: 7.7 % (ref 0.0–14.0)
NEUT#: 3.1 10*3/uL (ref 1.5–6.5)
NEUT%: 54.3 % (ref 39.0–75.0)
Platelets: 182 10*3/uL (ref 140–400)
RDW: 14 % (ref 11.0–14.6)

## 2008-12-22 ENCOUNTER — Ambulatory Visit: Payer: Self-pay | Admitting: Oncology

## 2008-12-22 LAB — CBC WITH DIFFERENTIAL/PLATELET
Basophils Absolute: 0.1 10*3/uL (ref 0.0–0.1)
Eosinophils Absolute: 0.3 10*3/uL (ref 0.0–0.5)
HGB: 13.8 g/dL (ref 13.0–17.1)
MONO#: 0.4 10*3/uL (ref 0.1–0.9)
NEUT#: 2.3 10*3/uL (ref 1.5–6.5)
RDW: 13 % (ref 11.0–14.6)
WBC: 4.9 10*3/uL (ref 4.0–10.3)
lymph#: 1.9 10*3/uL (ref 0.9–3.3)

## 2009-01-11 LAB — CBC WITH DIFFERENTIAL/PLATELET
BASO%: 0.8 % (ref 0.0–2.0)
EOS%: 3.2 % (ref 0.0–7.0)
LYMPH%: 28.6 % (ref 14.0–49.0)
MCH: 30.4 pg (ref 27.2–33.4)
MCHC: 34.9 g/dL (ref 32.0–36.0)
MCV: 87.1 fL (ref 79.3–98.0)
MONO%: 8.6 % (ref 0.0–14.0)
Platelets: 161 10*3/uL (ref 140–400)
RBC: 4.27 10*6/uL (ref 4.20–5.82)

## 2009-01-26 LAB — COMPREHENSIVE METABOLIC PANEL WITH GFR
ALT: 25 U/L (ref 0–53)
AST: 37 U/L (ref 0–37)
Albumin: 3.9 g/dL (ref 3.5–5.2)
Alkaline Phosphatase: 84 U/L (ref 39–117)
BUN: 20 mg/dL (ref 6–23)
CO2: 28 meq/L (ref 19–32)
Calcium: 8.7 mg/dL (ref 8.4–10.5)
Chloride: 104 meq/L (ref 96–112)
Creatinine, Ser: 1 mg/dL (ref 0.40–1.50)
Glucose, Bld: 81 mg/dL (ref 70–99)
Potassium: 4.7 meq/L (ref 3.5–5.3)
Sodium: 137 meq/L (ref 135–145)
Total Bilirubin: 1.2 mg/dL (ref 0.3–1.2)
Total Protein: 6.7 g/dL (ref 6.0–8.3)

## 2009-01-26 LAB — CBC WITH DIFFERENTIAL/PLATELET
BASO%: 0.7 % (ref 0.0–2.0)
Basophils Absolute: 0 10*3/uL (ref 0.0–0.1)
EOS%: 4.8 % (ref 0.0–7.0)
Eosinophils Absolute: 0.2 10*3/uL (ref 0.0–0.5)
HCT: 37.4 % — ABNORMAL LOW (ref 38.4–49.9)
HGB: 12.9 g/dL — ABNORMAL LOW (ref 13.0–17.1)
LYMPH%: 37.5 % (ref 14.0–49.0)
MCH: 31.5 pg (ref 27.2–33.4)
MCHC: 34.3 g/dL (ref 32.0–36.0)
MCV: 91.8 fL (ref 79.3–98.0)
MONO#: 0.4 10*3/uL (ref 0.1–0.9)
MONO%: 8.8 % (ref 0.0–14.0)
NEUT#: 2.2 10*3/uL (ref 1.5–6.5)
NEUT%: 48.2 % (ref 39.0–75.0)
Platelets: 187 10*3/uL (ref 140–400)
RBC: 4.08 10*6/uL — ABNORMAL LOW (ref 4.20–5.82)
RDW: 12.9 % (ref 11.0–14.6)
WBC: 4.5 10*3/uL (ref 4.0–10.3)
lymph#: 1.7 10*3/uL (ref 0.9–3.3)

## 2009-01-26 LAB — LACTATE DEHYDROGENASE: LDH: 153 U/L (ref 94–250)

## 2009-02-18 ENCOUNTER — Encounter: Admission: RE | Admit: 2009-02-18 | Discharge: 2009-02-18 | Payer: Self-pay | Admitting: Gastroenterology

## 2009-02-19 ENCOUNTER — Ambulatory Visit: Payer: Self-pay | Admitting: Oncology

## 2009-02-23 LAB — CBC WITH DIFFERENTIAL/PLATELET
Basophils Absolute: 0.1 10*3/uL (ref 0.0–0.1)
EOS%: 4.7 % (ref 0.0–7.0)
Eosinophils Absolute: 0.3 10*3/uL (ref 0.0–0.5)
HGB: 13.7 g/dL (ref 13.0–17.1)
LYMPH%: 36.8 % (ref 14.0–49.0)
MCH: 31.2 pg (ref 27.2–33.4)
MCV: 87.5 fL (ref 79.3–98.0)
MONO%: 8.5 % (ref 0.0–14.0)
NEUT#: 2.7 10*3/uL (ref 1.5–6.5)
Platelets: 172 10*3/uL (ref 140–400)

## 2009-03-23 ENCOUNTER — Ambulatory Visit: Payer: Self-pay | Admitting: Oncology

## 2009-03-23 LAB — CBC WITH DIFFERENTIAL/PLATELET
Basophils Absolute: 0.1 10*3/uL (ref 0.0–0.1)
EOS%: 3.8 % (ref 0.0–7.0)
Eosinophils Absolute: 0.2 10*3/uL (ref 0.0–0.5)
MCH: 31.3 pg (ref 27.2–33.4)
MCHC: 35.6 g/dL (ref 32.0–36.0)
MONO#: 0.5 10*3/uL (ref 0.1–0.9)
NEUT%: 53.6 % (ref 39.0–75.0)
Platelets: 174 10*3/uL (ref 140–400)
nRBC: 0 % (ref 0–0)

## 2009-04-20 LAB — CBC WITH DIFFERENTIAL/PLATELET
Basophils Absolute: 0 10*3/uL (ref 0.0–0.1)
Eosinophils Absolute: 0.2 10*3/uL (ref 0.0–0.5)
HCT: 39.7 % (ref 38.4–49.9)
LYMPH%: 36.5 % (ref 14.0–49.0)
MCHC: 34.6 g/dL (ref 32.0–36.0)
MCV: 92.6 fL (ref 79.3–98.0)
MONO#: 0.5 10*3/uL (ref 0.1–0.9)
NEUT#: 2.7 10*3/uL (ref 1.5–6.5)
NEUT%: 49.9 % (ref 39.0–75.0)
RBC: 4.29 10*6/uL (ref 4.20–5.82)
WBC: 5.5 10*3/uL (ref 4.0–10.3)

## 2009-05-26 ENCOUNTER — Ambulatory Visit: Payer: Self-pay | Admitting: Oncology

## 2009-05-28 ENCOUNTER — Encounter: Payer: Self-pay | Admitting: Internal Medicine

## 2009-05-28 LAB — COMPREHENSIVE METABOLIC PANEL
ALT: 18 U/L (ref 0–53)
AST: 27 U/L (ref 0–37)
Alkaline Phosphatase: 92 U/L (ref 39–117)
Calcium: 9.1 mg/dL (ref 8.4–10.5)
Chloride: 103 mEq/L (ref 96–112)
Creatinine, Ser: 0.82 mg/dL (ref 0.40–1.50)
Potassium: 4.1 mEq/L (ref 3.5–5.3)
Sodium: 139 mEq/L (ref 135–145)

## 2009-05-28 LAB — CBC WITH DIFFERENTIAL/PLATELET
BASO%: 0.7 % (ref 0.0–2.0)
Basophils Absolute: 0 10*3/uL (ref 0.0–0.1)
EOS%: 4 % (ref 0.0–7.0)
HCT: 37.2 % — ABNORMAL LOW (ref 38.4–49.9)
LYMPH%: 28.4 % (ref 14.0–49.0)
MONO%: 7.9 % (ref 0.0–14.0)
Platelets: 187 10*3/uL (ref 140–400)

## 2009-07-21 ENCOUNTER — Ambulatory Visit: Payer: Self-pay | Admitting: Oncology

## 2009-07-23 LAB — CBC WITH DIFFERENTIAL/PLATELET
Basophils Absolute: 0.1 10*3/uL (ref 0.0–0.1)
Eosinophils Absolute: 0.2 10*3/uL (ref 0.0–0.5)
LYMPH%: 29.7 % (ref 14.0–49.0)
MCV: 94.2 fL (ref 79.3–98.0)
MONO%: 9.6 % (ref 0.0–14.0)
NEUT#: 2.8 10*3/uL (ref 1.5–6.5)
NEUT%: 55.4 % (ref 39.0–75.0)
Platelets: 173 10*3/uL (ref 140–400)
RBC: 3.93 10*6/uL — ABNORMAL LOW (ref 4.20–5.82)
RDW: 12.9 % (ref 11.0–14.6)
WBC: 5.1 10*3/uL (ref 4.0–10.3)
lymph#: 1.5 10*3/uL (ref 0.9–3.3)

## 2009-09-15 ENCOUNTER — Ambulatory Visit: Payer: Self-pay | Admitting: Oncology

## 2009-09-17 LAB — CBC WITH DIFFERENTIAL/PLATELET
BASO%: 1.3 % (ref 0.0–2.0)
EOS%: 3.7 % (ref 0.0–7.0)
HCT: 39.8 % (ref 38.4–49.9)
HGB: 13.5 g/dL (ref 13.0–17.1)
MCV: 93 fL (ref 79.3–98.0)
Platelets: 163 10*3/uL (ref 140–400)
RBC: 4.28 10*6/uL (ref 4.20–5.82)
WBC: 5.2 10*3/uL (ref 4.0–10.3)
nRBC: 0 % (ref 0–0)

## 2009-11-10 ENCOUNTER — Ambulatory Visit: Payer: Self-pay | Admitting: Oncology

## 2009-11-12 LAB — CBC WITH DIFFERENTIAL/PLATELET
Basophils Absolute: 0.1 10*3/uL (ref 0.0–0.1)
EOS%: 4.6 % (ref 0.0–7.0)
MCH: 31.4 pg (ref 27.2–33.4)
NEUT#: 3.1 10*3/uL (ref 1.5–6.5)
NEUT%: 52.8 % (ref 39.0–75.0)
Platelets: 157 10*3/uL (ref 140–400)
RBC: 4.33 10*6/uL (ref 4.20–5.82)
RDW: 12.1 % (ref 11.0–14.6)
nRBC: 0 % (ref 0–0)

## 2009-11-26 LAB — COMPREHENSIVE METABOLIC PANEL
Alkaline Phosphatase: 95 U/L (ref 39–117)
BUN: 20 mg/dL (ref 6–23)
Calcium: 9 mg/dL (ref 8.4–10.5)
Chloride: 102 mEq/L (ref 96–112)
Creatinine, Ser: 0.89 mg/dL (ref 0.40–1.50)
Glucose, Bld: 98 mg/dL (ref 70–99)
Potassium: 4.5 mEq/L (ref 3.5–5.3)
Sodium: 138 mEq/L (ref 135–145)

## 2009-11-26 LAB — LACTATE DEHYDROGENASE: LDH: 166 U/L (ref 94–250)

## 2009-11-26 LAB — CBC WITH DIFFERENTIAL/PLATELET
Eosinophils Absolute: 0.2 10*3/uL (ref 0.0–0.5)
HCT: 38.3 % — ABNORMAL LOW (ref 38.4–49.9)
HGB: 13.1 g/dL (ref 13.0–17.1)
LYMPH%: 28.2 % (ref 14.0–49.0)
MCH: 32.5 pg (ref 27.2–33.4)
MONO%: 9 % (ref 0.0–14.0)
NEUT#: 2.6 10*3/uL (ref 1.5–6.5)
RBC: 4.03 10*6/uL — ABNORMAL LOW (ref 4.20–5.82)
RDW: 12.5 % (ref 11.0–14.6)
WBC: 4.4 10*3/uL (ref 4.0–10.3)

## 2010-01-07 ENCOUNTER — Ambulatory Visit: Payer: Self-pay | Admitting: Oncology

## 2010-01-07 LAB — CBC WITH DIFFERENTIAL/PLATELET
Basophils Absolute: 0 10*3/uL (ref 0.0–0.1)
EOS%: 4.2 % (ref 0.0–7.0)
Eosinophils Absolute: 0.2 10*3/uL (ref 0.0–0.5)
HCT: 39.7 % (ref 38.4–49.9)
LYMPH%: 26.1 % (ref 14.0–49.0)
MCHC: 34.2 g/dL (ref 32.0–36.0)
MCV: 94.1 fL (ref 79.3–98.0)
Platelets: 186 10*3/uL (ref 140–400)
RBC: 4.21 10*6/uL (ref 4.20–5.82)
RDW: 13.2 % (ref 11.0–14.6)
WBC: 5.7 10*3/uL (ref 4.0–10.3)
lymph#: 1.5 10*3/uL (ref 0.9–3.3)

## 2010-04-25 ENCOUNTER — Ambulatory Visit: Payer: Self-pay | Admitting: Oncology

## 2010-04-25 LAB — CBC WITH DIFFERENTIAL/PLATELET
EOS%: 4 % (ref 0.0–7.0)
HCT: 40.4 % (ref 38.4–49.9)
HGB: 13.9 g/dL (ref 13.0–17.1)
LYMPH%: 24.7 % (ref 14.0–49.0)
MCHC: 34.4 g/dL (ref 32.0–36.0)
MCV: 92 fL (ref 79.3–98.0)
MONO%: 8.8 % (ref 0.0–14.0)
NEUT%: 61.5 % (ref 39.0–75.0)
Platelets: 168 10*3/uL (ref 140–400)
WBC: 6 10*3/uL (ref 4.0–10.3)
lymph#: 1.5 10*3/uL (ref 0.9–3.3)

## 2010-05-26 ENCOUNTER — Ambulatory Visit: Payer: Self-pay | Admitting: Oncology

## 2010-05-30 ENCOUNTER — Encounter: Payer: Self-pay | Admitting: Internal Medicine

## 2010-05-30 LAB — COMPREHENSIVE METABOLIC PANEL
ALT: 22 U/L (ref 0–53)
BUN: 18 mg/dL (ref 6–23)
CO2: 28 mEq/L (ref 19–32)
Chloride: 104 mEq/L (ref 96–112)
Potassium: 4.4 mEq/L (ref 3.5–5.3)
Sodium: 138 mEq/L (ref 135–145)

## 2010-05-30 LAB — CBC WITH DIFFERENTIAL/PLATELET
BASO%: 0.7 % (ref 0.0–2.0)
Basophils Absolute: 0 10*3/uL (ref 0.0–0.1)
EOS%: 3 % (ref 0.0–7.0)
Eosinophils Absolute: 0.1 10*3/uL (ref 0.0–0.5)
MCH: 32 pg (ref 27.2–33.4)
MCHC: 34.2 g/dL (ref 32.0–36.0)
MCV: 93.5 fL (ref 79.3–98.0)
MONO%: 10 % (ref 0.0–14.0)
NEUT%: 58.3 % (ref 39.0–75.0)
RDW: 12.5 % (ref 11.0–14.6)
WBC: 4.9 10*3/uL (ref 4.0–10.3)

## 2010-10-06 NOTE — Letter (Signed)
Summary: [facility]  [facility]   Imported By: Sherian Rein 06/14/2010 07:45:51  _____________________________________________________________________  External Attachment:    Type:   Image     Comment:   External Document

## 2010-11-28 ENCOUNTER — Other Ambulatory Visit (HOSPITAL_COMMUNITY): Payer: Self-pay | Admitting: Oncology

## 2010-11-28 ENCOUNTER — Encounter (HOSPITAL_BASED_OUTPATIENT_CLINIC_OR_DEPARTMENT_OTHER): Payer: BC Managed Care – PPO | Admitting: Oncology

## 2010-11-28 DIAGNOSIS — D693 Immune thrombocytopenic purpura: Secondary | ICD-10-CM

## 2010-11-28 LAB — CBC WITH DIFFERENTIAL/PLATELET
BASO%: 0.6 % (ref 0.0–2.0)
HCT: 40.1 % (ref 38.4–49.9)
HGB: 13.8 g/dL (ref 13.0–17.1)
MCHC: 34.4 g/dL (ref 32.0–36.0)
MCV: 86.4 fL (ref 79.3–98.0)
MONO#: 0.5 10*3/uL (ref 0.1–0.9)
WBC: 5.2 10*3/uL (ref 4.0–10.3)

## 2011-01-17 NOTE — Discharge Summary (Signed)
Jacob Mayo, Jacob Mayo                ACCOUNT NO.:  [id number]   MEDICAL RECORD NO.:  [id number]          PATIENT TYPE:  INP   LOCATION:  1529                         FACILITY:  [facility]   PHYSICIAN:  Kela Millin, M.D.DATE OF BIRTH:  [birth year]   DATE OF ADMISSION:  07/23/2007  DATE OF DISCHARGE:  07/26/2007                               DISCHARGE SUMMARY   DISCHARGE DIAGNOSES:  1. 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  2. History of gout.  3. History of gastric bypass surgery.  4. 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  5. Previous history of hypertension and sleep apnea which resolved      following weight loss after the patient's bypass surgery.  6. History of benign prostate hypertrophy.   CONSULTATIONS:  Hematology, Dr. Arline Asp.   PROCEDURES/STUDIES:  CT scan of abdomen and pelvis.  No evidence of  acute intra-abdominal hemorrhage.  Spleen is normal size.  No  explanation for thrombocytopenia demonstrated.  Stable post surgical  changes related to gastric bypass procedure.  Bilateral renal cysts.   HISTORY OF PRESENT ILLNESS:  The patient is a 60 year old, white male  with above-listed medical problems who presented with complaints of easy  bruisability. He stated that he had been in his usual state of health  until about a week prior to presentation when he started having problems  with bruising, significantly large bruises on his arms as well as some  small bruises on his lower extremities.  He also admitted to mild  nosebleeds as well as small ulcerations blood blisters in his mouth.  He got concerned and went to the [facility].  Lab work was  done and revealed a platelet count of 4.  He was sent to the ER to be  admitted.   Please see the full admission history and physical dictated on November  18, by Dr. Rito Ehrlich for the details of the admission physical exam as  well as the  laboratory data.   HOSPITAL COURSE:  Problem 1.  SEVERE THROMBOCYTOPENIA:  This is likely  idiopathic thrombocytopenic purpura per hematologist.  Upon admission,  the patient was given platelet transfusions.  His platelet count  following these transfusions came up to 19.  Hematology was consulted  and Dr. Arline Asp saw the patient and indicated following his evaluation,  that the most likely diagnosis was idiopathic thrombocytopenic purpura.  The patient had an HIV test which was nonreactive.  Rheumatoid factor  was negative and EBV testing revealed that the patient has had EBV in  the past (IgM was negative).  The patient's ANA is pending at the time  of this dictation and the patient is to follow up with Dr. Arline Asp next  week as directed.  Upon admission, the patient's allopurinol was also  held as it is a known cause of thrombocytopenia/pancytopenia.  Resuming  this medication upon discharge was discussed with Dr. Arline Asp and it  was noted that the patient had been on allopurinol for over 10 years.  Per hematology, this was probably a less  likely cause of his  thrombocytopenia.  He has recommended he continue his allopurinol and  his platelet counts will be monitored for any drops.  The patient was  started on IV Solu-Medrol in the hospital and his platelet count  improved, at 36 today.  Prior to discharge, the patient has not had any  gross evidence of bleeding in the hospital.  He will be discharged on  prednisone 80 mg daily as recommended by Dr. Arline Asp and he is to  followup next week as already mentioned.   Problem 2.  HISTORY OF GOUT:  The patient to continue his allopurinol  upon discharge.   Problem 3.  HISTORY OF BENIGN PROSTATIC HYPERTROPHY:  The patient to  doxazosin.   Problem 4.  HISTORY OF GASTRIC BYPASS:  The patient to continue his  preadmission vitamins/medications.   Problem 5.  HISTORY OF WEGENER'S GRANULOMATOSIS:   DISCHARGE MEDICATIONS:  1. Prednisone  90 mg p.o. daily.  2. Prilosec 40 mg p.o. daily.  3. Continue his preadmission medications of allopurinol, Centrum      Silver, doxazosin, fish oil, Nasonex, Tums, Tylenol, vitamin B6 and      potassium.   FOLLOWUP:  1. Dr. Arline Asp next week as scheduled.  2. Dr. Catha Gosselin in 1-2 weeks.  The patient to call for      appointment.   CONDITION ON DISCHARGE:  Improved, stable.      Kela Millin, M.D.  Electronically Signed     ACV/MEDQ  D:  07/26/2007  T:  07/26/2007  Job:  [id number]   cc:   Caryn Bee L. Little, M.D.  Fax: [phone]

## 2011-01-17 NOTE — H&P (Signed)
NAME:  Jacob Mayo, Jacob Mayo NO.:  [id number]   MEDICAL RECORD NO.:  [id number]          PATIENT TYPE:  INP   LOCATION:  1337                         FACILITY[facility]  [facility]   PHYSICIAN:  Pierce Crane, MD        DATE OF BIRTH:  Aug 10, 1951   DATE OF ADMISSION:  08/04/2007  DATE OF DISCHARGE:                              HISTORY & PHYSICAL   The patient is a 60 year old Caucasian male who presented to the  hospital with new areas of ecchymosis and mild blood blisters.  The new  areas include bilateral tibial areas, right heel of the palm, right  upper and forearm, and mucosal blisters in both cheeks.  The patient  reported that he did have a slight nasal mucous tinged with blood but  there was not any evidence of an epistaxis.  The patient denies any  hemoptysis or hematuria.  No nausea and vomiting.  No headache, no  change in vision, no change in hearing, no chest pain or palpitations.  No abdominal pain or cramping.  No lower extremity petechial rash and no  lower extremity or generalized petechial rash noted.   The patient was diagnosed with ITP when he came into the hospital on  November 18, with nose bleeds and suffering from bruises on his arms and  legs.  Hematology was consulted and it was thought that the patient was  suffering from ITP.  He received platelet transfusion, high dose  steroids, and his platelets at the time of discharge on July 26, 2007, was 36,000 with a normal hemoglobin and hematocrit.  He was  discharged on prednisone 20 mg p.o. daily and he was discharged on  prednisone 80 mg daily.  He had a follow-up appointment in the  hematologist's office the following week and he was started on Rituximab  325 mg per meter squared and brings [place] to the present date of new  ecchymotic areas with laboratory data pending.   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   MEDICATIONS:  1. Doxazosin 8 mg p.o. daily.  2. Nasonex one spray to each nostril daily.  3. Potassium citrate two tablets twice a day.  4. Vitamin B6 500 mcg p.o. daily.  5. Vitamin D 1000 units p.o. daily.  6. Multivitamin one tablet p.o. daily.  7. Fish Oil 1000 mg p.o. daily.  8. TUMS two chewable tablets 500 mg p.o. daily.   ALLERGIES:  SULFA.   SOCIAL HISTORY:  Denies any smoking, alcohol, or illicit drug use.   FAMILY HISTORY:  Noncontributory.   PAST SURGICAL HISTORY:  Gastric bypass and appendectomy.   PHYSICAL EXAMINATION:  GENERAL:  In no acute distress.  VITAL SIGNS:  Stable.  SKIN:  There is some ecchymotic areas on his left upper and lower  forearm, bilateral tibial areas with new bruising, right palm with large  ecchymotic area to the heel, mucosal area blood blisters bilaterally.  HEART:  Regular rate and rhythm with no murmur or gallop.  ABDOMEN:  Soft.  Positive bowel sounds.  No hepatosplenomegaly.  LUNGS:  Clear to auscultation bilaterally.   LABORATORY DATA:  Pending for review.   ASSESSMENT:  The patient most likely has idiopathic thrombocytopenic  purpura with virology studies at his last hospitalization all negative.  He was given IV Solu-Medrol, discharged on prednisone p.o. 800 mg daily,  and one dose of Rituximab which the next dose is due on August 07, 2007.  It was noted at that time that he will get a bone marrow biopsy  but since he is hospitalized, it might be prudent to perform one here.  The patient also will have a peripheral smear performed and that will be  reviewed as well.  It is also noted that the patient does not have any  splenomegaly upon examination or radiologically.  Plan of care was  verbalized with the patient and discussed in collaboration with  physician.      Mike Craze, NP      Pierce Crane, MD  Electronically Signed    SAW/MEDQ  D:  08/04/2007  T:  08/04/2007  Job:  [id number]   cc:    Samul Dada, M.D.  Fax: [phone]

## 2011-01-17 NOTE — Consult Note (Signed)
NAME:  ARTEZ, REGIS NO.:  [id number]   MEDICAL RECORD NO.:  [id number]          PATIENT TYPE:  INP   LOCATION:  1529                         FACILITY:  [facility]   PHYSICIAN:  Samul Dada, M.D.DATE OF BIRTH:  Apr 24, 1951   DATE OF CONSULTATION:  07/24/2007  DATE OF DISCHARGE:                                 CONSULTATION   HISTORY:  Stephenson Cichy is a 60 year old white married male who I am  asked to see in consultation for thrombocytopenia.  Mr. Mineer has been  in generally stable health.  About a week ago, he noticed some easy  bruisability.  Over the last couple of days, he has had spontaneous  bruising.  He has had some blood blisters in his mouth and also some  minor nosebleeds.  He has not noticed any obvious blood in his urine or  stool although apparently he had some microscopic hematuria.  The  patient was seen in urgent care and sent to the emergency room where he  was admitted.  The patient's admission platelet count was 6000.  His  white count was 5.5, hemoglobin 13.4, hematocrit 38.1.  Subsequent  differential was normal.  Monospot screen was negative.  Chemistries  were normal.  PTT was 28, prothrombin time 14.3, with an INR of 1.1.  The patient has not had any prior bleeding problems, specifically any  thrombocytopenia.  There were no new medicines.  He denies using tonic  water.  He has not had any recent illnesses.  There was nothing in his  history that would suggest lupus or a malignancy.  Of note, however, is  the fact that the patient was diagnosed with Wegener's granulomatosis on  an open lung biopsy in 1997, seen by Dr. Fannie Knee.  Surgery was  carried out by Dr. Norton Blizzard.  The patient was treated with  prednisone in a tapering dose over about a year and a half with apparent  resolution and no recurrence of problems.  The patient received the  equivalent of 18 packs of platelets during the day today.  His 1-hour  post platelet  count was 20,000.   PAST MEDICAL HISTORY:  1. Patient had morbid obesity, underwent a gastric bypass operation by      Dr. Ovidio Kin in September 2006.  2. In October, he underwent an appendectomy.  3. He had a history of hypertension and sleep apnea, all of those have      resolved.  4. He does have a history of gout for which he is taking allopurinol  5. He also has a history of kidney stones and BPH for which he sees      Dr. Boston Service.   FAMILY HISTORY:  No history of blood disorders.  Father died of cancer  involving abdomen in his late 15s.  There was a lot of cancer of the  father's side of the family.  Mother is alive, doing well at age 29.  Patient has a sister who is healthy.  He has 2 adult children who are  healthy.  Also, 2 grandchildren  who are in good health.  No history of  any rheumatologic diseases.   SOCIAL HISTORY:  Patient denies any use of tobacco products, alcohol.  He denies any possible exposure to AIDS.  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  He likes to hunt,  fish, and sail.  He has been generally active.  He has been married for  38 years.  His wife, Bonita Quin, was with him tonight.   REVIEW OF SYSTEMS:  Patient has felt generally healthy.  No sense of ill  health or loss of energy.  No neurologic problems.  He has reading  glasses.  He has early cataracts.  No glaucoma.  Vision and hearing are  good.  He has had some sinus problems, uses Flonase.  He has seasonal  hay fever in spring and fall.  Weight at 1 time was 305 pounds.  Patient  now weighs about 170 to 175 pounds on a 5 feet 9-inch frame.  Appetite  is good.  Recently, he has had some diarrhea.  He had a colonoscopy a  couple years ago.  No obvious blood in the stools or liver problems.  No  heart or lung  problems.  He sees Dr. Wanda Plump for an enlarged prostate.  He is on a medicine for this, the name of which he cannot recall.  He  has had some intermittent stream at night but only 1 time nocturia.  Apparently, he had some hematuria on this admission.  No gross blood.  No swelling of the legs, history of blood clots, or intermittent  claudication.  No bleeding or bruising tendencies in the past.  He has  some arthritis in his fingers, also low back pain.  He has no history of  skin problems, rashes, or pruritus.  No psychological problems.   PHYSICAL EXAM:  Mr. Andal is a generally well-appearing gentleman in no  acute distress.  Weight is recorded at 76.5 kg.  Height 69 inches.  He  is afebrile.  Vital signs are normal.  Blood pressure 129/78.  There is  no scleral icterus.  Pupillary and extraocular movements are normal.  He  has a few small purpuric blisters in his mouth.  Mouth and pharynx were  otherwise benign.  Neck is without adenopathy, thyroid enlargement, or  bruit.  He has 1 purpuric lesion on his right neck.  Heart and lungs are  normal.  Back, no skeletal tenderness or deformity.  Abdomen is benign  with no organomegaly or masses palpable.  Extremities, no peripheral  edema, clubbing, or palmar erythema.  He does have fairly extensive  purpura, particularly over his right arm which he relates to the recoil  of shooting a rifle; that involves his right upper arm.  He also has  ecchymoses and purpura over his legs.  Neurologic exam is grossly  normal.   LABORATORY DATA:  In the HPI.  CBC from yesterday when he was admitted  notable for a platelet count of 6000.  After receiving the equivalent of  18 platelet packs, the patient's platelet count was 20,000 at  approximately 5:30 p.m.  White count 6.5, hemoglobin 12.8, hematocrit  36.7.  Inspection of the peripheral smear disclosed a few platelets that  are normal in size.  I did not see any platelet clumping  or large   platelets, nor did I see any other significant abnormal findings.  Red  cells looked fairly uniform.  No red cell or white cell precursors.   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  I cannot determine a etiologic factor unless he does have  autoimmune disease which is suggested by his diagnosis of Wegener's  granulomatosis.  We are checking an ANA and rheumatic factor, as well as  an human immunodeficiency virus.  Patient tolerated his steroid  treatments fairly well in the past without diabetes or ulcer disease.  He did, however, gain a lot of weight which may have had something to do  with his gastric bypass surgery that he underwent back in the fall of  2006.  We will start of with Solu-Medrol 80 mg IV every 8 hours.  Allopurinol which the patient was on has been temporarily held although  the patient has been on this for several years.  We will also start him  on  Protonix, check his glucose in the morning, as well as a CBG in the  afternoon.  Hopefully, the patient will respond promptly to this and can  be discharged to be followed as an outpatient.  Additional treatments to  consider if his response to steroids is suboptimal would be Rituxan,  IVIG, and splenectomy.      Samul Dada, M.D.  Electronically Signed     DSM/MEDQ  D:  07/24/2007  T:  07/25/2007  Job:  [id number]   cc:   Samul Dada, M.D.  Fax: [phone]   Kela Millin, M.D.   Clinton D. Maple Hudson, MD, FCCP, FACP  [facility]  [address]  [place]  [place] [address]   Anna Genre. Little, M.D.  Fax: [phone]

## 2011-01-17 NOTE — H&P (Signed)
[name], Jacob Mayo NO.:  [id number]   MEDICAL RECORD NO.:  [id number]          PATIENT TYPE:  INP   LOCATION:  0101                         FACILITY:  [facility]   PHYSICIAN:  Hollice Espy, M.D.DATE OF BIRTH:  04/09/1951   DATE OF ADMISSION:  07/23/2007  DATE OF DISCHARGE:                              HISTORY & PHYSICAL   PRIMARY CARE PHYSICIAN:  Caryn Bee L. Little, M.D.   CHIEF COMPLAINT:  Bruising.   HISTORY OF PRESENT ILLNESS:  The patient is a 60 year old white male  with past medical history of gastric bypass surgery two years ago, gout  and history 10 years ago of Wegener's granulomatosis who has been in  good health up until about a week ago.  He has had no recent viral  illnesses or upper respiratory infections, but a week ago he started  noticing he was having problems with bruising, significantly he is  having large bruits that appear on his arms and legs as well as some  small, tiny bruising on his lower extremities.  In addition, over the  last two days he has developed some mild small nosebleeds as well as  some small ulcerations in his mouth which he feels he can pop with his  tongue and induce some small amount of bleeding.  He became concerned  and came into [facility] to be evaluated where he had labs  done.  He was found to have normal hemoglobin and hematocrit but a  platelet count of only 4. He was sent over to the emergency room for  further evaluation.  Labs were repeated here and he was found to have  essentially normal lab work with the exception again of a platelet count  of 6.  The patient's blood pressure, heart rate and other issues were  stable.  Currently he is doing well.  He denies any other complaints  such as headaches, vision changes, dysphagia, chest pain, palpitations,  shortness of breath, wheeze, cough.  No abdominal pain, hematuria,  dysuria, constipation, diarrhea, focal extremity numbness, weakness or  pain.   He does note some uncomfortable ulcerations in his mouth, but  otherwise no other complaints.  Review of systems otherwise negative.   PAST MEDICAL HISTORY:  1. Wegener's granulomatosis treated over 10 years ago.  2. History of gastric bypass surgery.  3. Previous history of hypertension and sleep apnea which has resolved      after his surgery.  4. History of BPH.  5. History of gout.   MEDICATIONS:  1. Doxazosin 8.  2. Allopurinol 300.  3. Tylenol P.M. at bedtime.  4. Nasonex daily.  5. Potassium four times a day.  6. Vitamin B6 daily.  7. Vitamin D daily.  8. Centrum Silver daily.  9. Fish oil 1000 mg b.i.d.  10.Tums two tabs daily.   ALLERGIES:  SULFA.   SOCIAL HISTORY:  No tobacco, alcohol or drug use.   FAMILY HISTORY:  Noncontributory.   PHYSICAL EXAMINATION:  VITAL SIGNS:  On admission, temperature 97.9,  heart rate 76, blood pressure 120/75, respirations 18, oxygen saturation  100% on room air.  GENERAL:  The patient is alert and oriented x2, in no apparent distress.  HEENT:  Normocephalic, atraumatic with the exception of in his mouth he  has some small ulcerations which are not currently bleeding.  His mucous  membranes are otherwise normal.  HEART:  Regular rate and rhythm.  S1 and S2.  LUNGS:  Clear to auscultation bilaterally.  ABDOMEN:  Soft, nontender, nondistended.  Positive bowel sounds.  EXTREMITIES:  No clubbing, cyanosis or edema.  He has some large signs  of bruising, mostly in his upper extremities.  On his lower extremities  below the knees, he has some small petechiae.   LABORATORY DATA:  CT scan of the abdomen and pelvis has been ordered and  is pending.  White count is 12.5, hemoglobin 13.4, hematocrit 38, MCV  92, platelet count is only 6.  No shift.  Sodium 135, potassium 4.5,  chloride 102, bicarb 29, BUN 18, creatinine 0.89, glucose 95.  LFTs  unremarkable.  Normal calcium levels.  Coags unremarkable.   ASSESSMENT/PLAN:  1.  Thrombocytopenia.  Most likely cause is especially in light of the      patient's previous history of an autoimmune condition.  This is      what we call ITP or idiopathic thrombocytopenia purpura.  Will plan      to check a number of causes including EBV titer, monospot test,      purpura smear, parvo B19 titer, serum ACE level, the last of which      to rule out sarcoid although he has no signs of anemia, no signs      liver dysfunction, no signs of infection.  Will also check      platelets and associated antibody and given his extremely low      platelet count and some small mucous membrane bleeding.  We will go      ahead and transfuse three six-pack of platelets.  We will discuss      with hematology in the morning  the possibility of starting the      patient on gamma globulin and/or steroids.  In addition, I have      reviewed all of his medications and the only one associated with      the possibility of platelet destruction would be his allopurinol,      but for now we will hold all his medications.      Hollice Espy, M.D.  Electronically Signed     SKK/MEDQ  D:  07/23/2007  T:  07/24/2007  Job:  [id number]   cc:   Caryn Bee L. Little, M.D.  Fax: [phone]

## 2011-01-17 NOTE — Discharge Summary (Signed)
[name], [name] NO.:  [id number]   MEDICAL RECORD NO.:  [id number]          PATIENT TYPE:  INP   LOCATION:  1337                         FACILITY:  [facility]   PHYSICIAN:  Samul Dada, M.D.DATE OF BIRTH:  1950/11/18   DATE OF ADMISSION:  08/04/2007  DATE OF DISCHARGE:  08/08/2007                               DISCHARGE SUMMARY   DISCHARGE DIAGNOSES:  1. ITP.  2. History of Wegener granulomatosis.  3. History of gastric bypass surgery in 2006.   X-RAYS AND PROCEDURES:  On August 06, 2007, bone marrow biopsy and  aspirate, pathology of which revealed normocellular marrow with  trilineage hematopoiesis, nonparatrabecular lymphoid aggregates,  adequate storage iron.  Peripheral blood revealed severe  thrombocytopenia and mild normochromic, normocytic anemia.   DISCHARGE LABORATORY DATA:  CBC reveals white blood count of 8.9,  hemoglobin 12.1, hematocrit 33.7, platelets 38.  Chemistries reveal a  sodium of 135, potassium 4.1, chloride 102, BUN of 17, creatinine 0.6,  glucose of 106.   DISCHARGE VITAL SIGNS:  Temperature 97.6, heart rate 52, respirations  20, blood pressure 140/70, O2 saturation is 97% on room air.   DISCHARGE MEDICATIONS:  1. Doxazosin 8 mg p.o. daily.  2. Nasonex one spray to each nostril daily.  3. Vitamin D 1000 units p.o. daily.  4. Fish oil 1000 mg p.o. daily.  5. Tums 500 mg tablets two tablets p.o. daily.  6. Potassium citrate 20 mEq p.o. b.i.d.  7. Vitamin B12 500 mcg p.o. daily.  8. Decadron 40 mg p.o. on August 09, 2007, only.  9. Protonix 80 mg p.o. daily.  10.Multivitamin one p.o. daily.  11.Prednisone 80 mg p.o. daily starting August 10, 2007.   DISCHARGE DISPOSITION/SPECIAL INSTRUCTIONS:  The patient is discharged  in stable and improved condition.  He is advised to call MD if he  experiences any bleeding or episodes of increased bruising.  He is  advised on bleeding precautions and verbalized understanding.   FOLLOW-UP APPOINTMENTS:  The patient will follow up at [facility] on August 09, 2007, at 9:30 a.m. for laboratory data consisting  of CBC with differential.  He will then return to the [facility] on August 12, 2007, for laboratory data consisting of CBC with  differential.  This lab will be at 4:15 p.m.  He will be scheduled for a  follow-up appointment with Dr. Arline Asp on August 13, 2007, at which  time he will have a CBC with differential, CMET and LDH.  We also  anticipate him receiving another dose of Rituxan after his follow-up  visit with Dr. Arline Asp.   HISTORY OF PRESENT ILLNESS:  Jacob Mayo is a 60 year old white  male with ITP.  He initially was evaluated by oncology as a consultation  on July 24, 2007, for evaluation of thrombocytopenia and easy  bruising.  The etiology was unknown, although the patient may have had  autoimmune disease as suggested by his history of Wegener  granulomatosis.  The patient was subsequently started on IV Solu-Medrol,  then converted to oral prednisone 80 mg p.o.  daily with initial response  with platelet increase from 4000 to 36,000.  The patient was  subsequently discharged home and was scheduled for outpatient and when  he was seen in the oncology clinic for routine CBC on August 02, 2007,  he was noted to have platelet count less than 6000, confirmed by smear,  and he also reported intermittent nose bleeds.  The patient received  pheresed platelets and also was initiated on Rituxan with a plan for  weekly treatments.  He was advised on bleeding precautions.  The patient  subsequently presented to the emergency room on August 04, 2007, for  evaluation of new areas of ecchymosis on his extremities as well as what  he described as blood blisters in his mouth on bilateral cheeks.  The  patient was subsequently admitted for further workup and management.   HOSPITAL COURSE:  Due to the patient's known  ITP and decreased platelet  count, he was initiated on IVIG on November 30 through August 07, 2007.  He also continued on steroids with Decadron and received 40 mg IV on  December 2, 3 and 4.  He was maintained on Protonix for GI prophylaxis.  He also received pheresed platelets.  The patient remained on bleeding  precautions for the course of his hospitalization.  On August 06, 2007,  he underwent bone marrow biopsy and aspirate, pathology report of which  is mentioned in the x-ray procedures in this dictation.  He received his  second dose of Rituxan on August 06, 2007.  Also of note, the patient  had stools checked for occult blood on August 06, 2007, which  subsequently returned as negative.  On August 08, 2007, the patient was  noted to have platelet count increased up to 38,000, he was not  experiencing any bleeding whatsoever, he had normal energy level, no  shortness of breath or pain, no nausea or vomiting or constipation or  diarrhea.  The patient was discharged home in stable condition.  Prior  to discharge he did receive Decadron 40 mg p.o.  He will also take his  day #4 of Decadron tomorrow and was given a prescription for this, and  the patient will follow up in the oncology clinic with Dr. Kimberlee Nearing, and he is advised to call the MD if he has any episodes of  bleeding or notes increased bruising, and he verbalized understanding.   COMMENT:  The patient was evaluated by Dr. Arline Asp prior to discharge,  and the plan of care was formulated in consultation with Dr. Arline Asp.      Sherilyn Banker, NP      Samul Dada, M.D.  Electronically Signed    RJ/MEDQ  D:  08/08/2007  T:  08/08/2007  Job:  [id number]   cc:   Caryn Bee L. Little, M.D.  Fax: [phone]   Kela Millin, M.D.   Clinton D. Maple Hudson, MD, FCCP, FACP  [facility]  [address]  [place]  [place] [address]   Sandria Bales. Ezzard Standing, M.D.  [address]  [address]  [place]  [place] [address]

## 2011-01-20 NOTE — Op Note (Signed)
Jacob Mayo, Jacob Mayo NO.:  [id number]   MEDICAL RECORD NO.:  [id number]          PATIENT TYPE:  INP   LOCATION:  1011                         FACILITY:  [facility]   PHYSICIAN:  Sandria Bales. Ezzard Standing, M.D.  DATE OF BIRTH:  1951-03-28   DATE OF PROCEDURE:  11/27/2005  DATE OF DISCHARGE:  11/28/2005                                 OPERATIVE REPORT   PREOPERATIVE DIAGNOSIS:  Right lower quadrant abdominal pain, possible  internal hernia versus bowel perforation.   POSTOPERATIVE DIAGNOSIS:  Right lower quadrant pain, etiology unclear (no  evidence of internal hernia, no evidence of ischemia, no evidence of acute  inflammation).  Peculiar subserosal air pockets at the terminal ileum which  may be secondary to abdominal access.   PROCEDURE:  Diagnostic laparoscopy and laparoscopic appendectomy.   SURGEON:  Sandria Bales. Ezzard Standing, M.D.   FIRST ASSISTANT:  Alfonse Ras, MD   ANESTHESIA:  General endotracheal.   ESTIMATED BLOOD LOSS:  Minimal.   INDICATIONS FOR PROCEDURE:  Mr. Jacinto is a 60 year old white male who had a  laparoscopic Roux-en-Y gastric bypass on May 09, 2005.  So he is  approximately six months out from his surgery.  He has had a successful  weight loss of close to 100 pounds.  Has done very well until now with no  postop problems or complications, but developed acute abdominal pain, which  localized to his right lower quadrant.  He came to the emergency room and  was seen by Dr. Colin Benton.  I think he has also had some nausea and vomiting but  had a normal white blood count.  She was concerned about possible internal  hernia versus ischemic bowel and thougt the best thing would be to  laparoscope him for diagnostic purposes rather than trying to run further  diagnostic tests.   The indications and potential complications were explained to the patient.   OPERATIVE NOTE:  Patient given a general endotracheal anesthetic, as  supervised by Dr. Lucille Passy.  His left arm was tucked by his side.  He had a Foley catheter in place.  He was given antibiotics.  His abdomen  was prepped and draped with Betadine solution and sterilely draped.   I went through an infraumbilical incision.  It looks like he had a prior  umbilical hernia repair.  I am not sure if I went through mesh or not  getting into his abdominal cavity.  I entered the abdominal cavity.   I placed a 0 degree 10 mm laparoscope through a 12 mm Hasson trocar and  secured the trocar with a 0 Vicryl suture.  I then carried out abdominal  exploration.  I placed a 5 mm port in his left upper quadrant, a 5 mm port  in his left lower quadrant, and used this to help move bowel around.  I went  down to the right lower quadrant first.  His appendix looked grossly normal.  The cecum looked normal, although I could feel a little doughy feeling, that  it had some stool in it.  Along this terminal ileum,  there was kind of a  peculiar subserosal kind of air.  I took photos of this.  I then ran the  small bowel all the way back up to his anastomosis.   Of note, he had a Meckel's diverticulum that I resected with his last  operation, and I saw the staple line where the Meckel's had been, and this  was normal.  His small bowel was normal.  I got up to his J-J, which  appeared to be normal.  I went down the biliary limb, which was normal.  I  went up the gastric limb to his gastric pouch.  Again, there was no evidence  of contamination, perforation, or any abnormal fluid.  There was no evidence  of internal hernia.  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  I did note that he had some  mildly dilated right transverse colon, but there was no evidence of any  obstruction that I could see, and of note, I think Dr. Evette Cristal had  colonoscoped him, I think the summer before his bariatric surgery, and he  found a polyp but no other significant  colonic lesion.   He had no evidence of inguinal hernia.  He had no evidence of  retroperitoneal blood, hemorrhage, or air.  All he had was this unusual air  along his terminal ileum, and this may have actually been secondary to the  infusion of the CO2 when I first insufflated his abdomen.   I did take his appendix out.  I took down the mesentery of the appendix with  a harmonic scalpel and used a white load of the Endo-GIA 45 stapler across  the base of the appendix and stapled this out.   The appendix was then delivered in to an EndoCatch bag and delivered through  his umbilicus.  I then reinspected the abdomen.  I did no irrigation because  there was really nothing to irrigate out.  Again, I looked around in his  terminal ileum and his right colon.  This is primarily the location of where  his pain was, and I could really find no reason for this.   The trocars were then removed and there was no bleeding at any trocar site.  The umbilical port was closed with a 0 Vicryl suture.  The skin site was  closed with a 5-0 Vicryl suture and painted with tincture of Benzoin and  Steri-Strip.  The patient tolerated the procedure well and was transported  to the recovery room in good condition.      Sandria Bales. Ezzard Standing, M.D.  Electronically Signed     DHN/MEDQ  D:  11/28/2005  T:  11/29/2005  Job:  [id number]   cc:   Caryn Bee L. Little, M.D.  Fax: [phone]   Clinton D. Maple Hudson, M.D.  [facility] [place] [facility]  [address]  [place]  [place] [address]   Graylin Shiver, M.D.  Fax: [phone]

## 2011-03-15 ENCOUNTER — Telehealth (INDEPENDENT_AMBULATORY_CARE_PROVIDER_SITE_OTHER): Payer: Self-pay

## 2011-06-09 LAB — COMPREHENSIVE METABOLIC PANEL
AST: 39 — ABNORMAL HIGH
BUN: 19
CO2: 31
Chloride: 101
Creatinine, Ser: 0.76
GFR calc non Af Amer: >60
Glucose, Bld: 80
Total Bilirubin: 1.5 — ABNORMAL HIGH

## 2011-06-09 LAB — URINALYSIS, ROUTINE W REFLEX MICROSCOPIC
Bilirubin Urine: NEGATIVE
Glucose, UA: NEGATIVE
Ketones, ur: NEGATIVE
Nitrite: NEGATIVE
Protein, ur: NEGATIVE

## 2011-06-09 LAB — CBC
HCT: 35.9 — ABNORMAL LOW
Hemoglobin: 12.6 — ABNORMAL LOW
MCV: 94.2
RBC: 3.81 — ABNORMAL LOW
WBC: 9.3

## 2011-06-09 LAB — DIFFERENTIAL
Basophils Absolute: 0
Lymphocytes Relative: 15
Neutro Abs: 7.3

## 2011-06-09 LAB — PROTIME-INR: Prothrombin Time: 13.5

## 2011-06-09 LAB — APTT: aPTT: 21 — ABNORMAL LOW

## 2011-06-12 LAB — CBC
HCT: 33.7 — ABNORMAL LOW
HCT: 34.1 — ABNORMAL LOW
Hemoglobin: 12 — ABNORMAL LOW
Hemoglobin: 12.1 — ABNORMAL LOW
Hemoglobin: 12.4 — ABNORMAL LOW
MCHC: 35.2
MCHC: 35.3
MCV: 91.2
MCV: 92.8
Platelets: 15 — CL
RBC: 3.65 — ABNORMAL LOW
RBC: 3.66 — ABNORMAL LOW
RBC: 3.69 — ABNORMAL LOW
RDW: 13
RDW: 13.4
WBC: 7
WBC: 8.9

## 2011-06-12 LAB — DIFFERENTIAL
Basophils Absolute: 0
Basophils Relative: 0
Basophils Relative: 0
Eosinophils Absolute: 0.1 — ABNORMAL LOW
Eosinophils Absolute: 0.1 — ABNORMAL LOW
Eosinophils Relative: 0
Eosinophils Relative: 1
Lymphocytes Relative: 22
Lymphocytes Relative: 5 — ABNORMAL LOW
Lymphs Abs: 0.5 — ABNORMAL LOW
Lymphs Abs: 1.4
Lymphs Abs: 1.7
Monocytes Absolute: 0.3
Monocytes Absolute: 0.6
Monocytes Relative: 3
Neutro Abs: 3.9
Neutro Abs: 4.4
Neutrophils Relative %: 62

## 2011-06-12 LAB — BASIC METABOLIC PANEL
BUN: 16
CO2: 26
CO2: 29
Calcium: 8.6
Chloride: 101
Chloride: 102
Chloride: 102
Chloride: 104
Creatinine, Ser: 0.69
GFR calc Af Amer: >60
GFR calc Af Amer: >60
GFR calc non Af Amer: >60
GFR calc non Af Amer: >60
Glucose, Bld: 73
Glucose, Bld: 73
Potassium: 3.9
Potassium: 4.1
Potassium: 4.2
Sodium: 134 — ABNORMAL LOW
Sodium: 134 — ABNORMAL LOW
Sodium: 135

## 2011-06-12 LAB — OCCULT BLOOD (STOOL CUP TO LAB): Fecal Occult Bld: NEGATIVE

## 2011-06-12 LAB — PREPARE PLATELET PHERESIS

## 2011-06-12 LAB — CHROMOSOME ANALYSIS, BONE MARROW

## 2011-06-13 LAB — ANGIOTENSIN CONVERTING ENZYME: Angiotensin-Converting Enzyme: 47 U/L (ref 9–67)

## 2011-06-13 LAB — ANA: Anti Nuclear Antibody(ANA): NEGATIVE

## 2011-06-13 LAB — PREPARE PLATELET PHERESIS

## 2011-06-13 LAB — HIV ANTIBODY (ROUTINE TESTING W REFLEX): HIV: NONREACTIVE

## 2011-06-13 LAB — CBC
HCT: 36.9 — ABNORMAL LOW
HCT: 38.1 — ABNORMAL LOW
MCHC: 34.9
MCHC: 35
MCV: 91.6
MCV: 92.6
Platelets: 19 — CL
Platelets: 20 — CL
Platelets: 6 — CL
Platelets: 6 — CL
RBC: 3.86 — ABNORMAL LOW
RBC: 4.29
WBC: 2.7 — ABNORMAL LOW
WBC: 5.5
WBC: 5.9
WBC: 6.5
WBC: 8.6

## 2011-06-13 LAB — DIFFERENTIAL
Basophils Relative: 0
Basophils Relative: 1
Eosinophils Absolute: 0 — ABNORMAL LOW
Eosinophils Absolute: 0.2
Eosinophils Relative: 0
Eosinophils Relative: 3
Lymphs Abs: 0.6 — ABNORMAL LOW
Lymphs Abs: 0.7
Lymphs Abs: 1.7
Monocytes Absolute: 0.1
Monocytes Absolute: 0.4
Monocytes Absolute: 0.6
Monocytes Relative: 2 — ABNORMAL LOW
Monocytes Relative: 4
Neutro Abs: 7.6
Neutrophils Relative %: 57
Neutrophils Relative %: 57
Neutrophils Relative %: 88 — ABNORMAL HIGH

## 2011-06-13 LAB — COMPREHENSIVE METABOLIC PANEL
ALT: 37
AST: 37
Albumin: 3.7
Albumin: 4.2
BUN: 18
CO2: 32
Calcium: 8.8
Calcium: 9
Chloride: 102
Creatinine, Ser: 0.89
GFR calc Af Amer: >60
GFR calc non Af Amer: >60
GFR calc non Af Amer: >60
Sodium: 138
Total Bilirubin: 1.3 — ABNORMAL HIGH

## 2011-06-13 LAB — TECHNOLOGIST SMEAR REVIEW: Path Review: DECREASED

## 2011-06-13 LAB — PARVOVIRUS B19 ANTIBODY, IGG AND IGM: Parovirus B19 IgG Abs: 3.1 Index — ABNORMAL HIGH (ref <0.9)

## 2011-06-13 LAB — BASIC METABOLIC PANEL
BUN: 16
Calcium: 9.4
Creatinine, Ser: 0.67
Creatinine, Ser: 0.73
GFR calc Af Amer: >60
GFR calc non Af Amer: >60
Potassium: 4.1

## 2011-06-13 LAB — PLT GLYCOPROTEIN (INDIRECT) AUTOABS
Plt GP Ia/IIa Autoabs: NOT DETECTED
Plt Glyco IIb/IIIA Autoabs: NOT DETECTED
Plt Glycoprotein Ib/IX Autoabs: NOT DETECTED

## 2011-06-13 LAB — PROTIME-INR: Prothrombin Time: 14.3

## 2011-06-13 LAB — RHEUMATOID FACTOR: Rhuematoid fact SerPl-aCnc: <20

## 2011-06-13 LAB — PLATELET ANTIBODIES, DIRECT: Platelet IgG Ab, Direct: POSITIVE

## 2012-02-15 ENCOUNTER — Encounter: Payer: Self-pay | Admitting: Internal Medicine

## 2012-02-15 ENCOUNTER — Ambulatory Visit (INDEPENDENT_AMBULATORY_CARE_PROVIDER_SITE_OTHER): Payer: BC Managed Care – PPO | Admitting: Internal Medicine

## 2012-02-15 VITALS — BP 118/78 | HR 71 | Ht 70.0 in | Wt 192.6 lb

## 2012-02-15 DIAGNOSIS — J441 Chronic obstructive pulmonary disease with (acute) exacerbation: Secondary | ICD-10-CM

## 2012-02-15 DIAGNOSIS — R51 Headache: Secondary | ICD-10-CM

## 2012-02-15 MED ORDER — PREDNISONE 10 MG PO TABS: ORAL_TABLET | ORAL | Status: DC

## 2012-02-15 NOTE — Patient Instructions (Addendum)
Script sent for prednisone taper  Please call as needed 

## 2012-02-15 NOTE — Progress Notes (Signed)
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 In 1997 he was diagnosed by open lung biopsy with Wegener's granulomatosis and treated successfully with prednisone and Cytoxan. He comes now, asking allergy evaluation because of persistent cough. He describes morning cough or years. He got temporary relief from Depo-Medrol. He describes aching all over, frontal headaches which have been going on for 3 or 4 weeks. He did have a history of seasonal allergic rhinitis in the springtime and had been on allergy vaccine until his lung biopsy in 1997. Claritin helps his postnasal drainage some. A chest x-ray was reported clear. He denies fever, adenopathy, blood, shortness of breath or problem with his kidneys. Sputum is clear and thick. Many years ago he was diagnosed with obstructive sleep apnea but that resolved with sustained weight loss. He is a widower, wife died of lung cancer probably metastatic from breast. He teaches shop.  Prior to Admission medications   Medication Sig Start Date End Date Taking? Authorizing Provider  allopurinol (ZYLOPRIM) 100 MG tablet Take 2 tablets by mouth daily. 01/26/12  Yes Historical Provider, MD  CALCIUM CITRATE PO Take 2 tablets by mouth daily.   Yes Historical Provider, MD  Cholecalciferol (VITAMIN D) 2000 UNITS tablet Take 2,000 Units by mouth daily.   Yes Historical Provider, MD  COLCRYS 0.6 MG tablet Take 1 tablet by mouth Twice daily. 11/29/11  Yes Historical Provider, MD  dapsone 25 MG tablet Take 25 mg by mouth daily as needed.   Yes Historical Provider, MD  diphenhydrAMINE (SIMPLY SLEEP) 25 MG tablet Take 25 mg by mouth at bedtime as needed.   Yes Historical Provider, MD  doxazosin (CARDURA) 8 MG tablet Take 1 tablet by mouth At bedtime. 11/29/11  Yes Historical Provider, MD  doxycycline (VIBRAMYCIN) 100 MG capsule Take 1 tablet by mouth daily. Per Dr Elmer Picker 01/13/12  Yes Historical Provider, MD  fish oil-omega-3 fatty  acids 1000 MG capsule Take 1 g by mouth daily.   Yes Historical Provider, MD  fluticasone (FLONASE) 50 MCG/ACT nasal spray Place 1 puff into the nose daily. 11/29/11  Yes Historical Provider, MD  Multiple Vitamin (MULTIVITAMIN) tablet Take 1 tablet by mouth daily.   Yes Historical Provider, MD  potassium chloride (K-DUR) 10 MEQ tablet Take 20 mEq by mouth 2 (two) times daily.   Yes Historical Provider, MD  vitamin B-12 (CYANOCOBALAMIN) 1000 MCG tablet Take 1,000 mcg by mouth daily.   Yes Historical Provider, MD  predniSONE (DELTASONE) 10 MG tablet 4 X 2 DAYS, 3 X 2 DAYS, 2 X 2 DAYS, 1 X 2 DAYS 02/15/12 02/14/13  Waymon Budge, MD   Past Medical History  Diagnosis Date  . Sleep apnea   . Wegener's granulomatosis (granulomatosis with polyangiitis) 1997    Open lung biopsy. Prednisone/Cytoxan   Past Surgical History  Procedure Date  . Gastric bypass   . Lung biopsy 1997   Family History  Problem Relation Age of Onset  . Cancer Father    History   Social History  . Marital Status: Widowed    Spouse Name: N/A    Number of Children: N/A  . Years of Education: N/A   Occupational History  . Teacher    Social History Main Topics  . Smoking status: Never Smoker   . Smokeless tobacco: Not on file  . Alcohol Use: Yes  . Drug Use: No  . Sexually Active: Not on file   Other Topics Concern  .  Not on file   Social History Narrative  . No narrative on file   ROS-see HPI Constitutional:   No-   weight loss, night sweats, fevers, chills, fatigue, lassitude. HEENT:   +  headaches, No-difficulty swallowing, tooth/dental problems, sore throat,       No-  sneezing, itching, ear ache, nasal congestion, post nasal drip,  CV:  No-   chest pain, orthopnea, PND, swelling in lower extremities, anasarca, dizziness, palpitations Resp: No-   shortness of breath with exertion or at rest.              +   productive cough,  No non-productive cough,  No- coughing up of blood.              No-    change in color of mucus.  No- wheezing.   Skin: No-   rash or lesions. GI:  No-   heartburn, indigestion, abdominal pain, nausea, vomiting, diarrhea,                 change in bowel habits, loss of appetite GU: No-   dysuria, change in color of urine, no urgency or frequency.  No- flank pain. MS:  No-   joint pain or swelling.  No- decreased range of motion.  No- back pain. + myalgias Neuro-     nothing unusual Psych:  No- change in mood or affect. No depression or anxiety.  No memory loss.  OBJ- Physical Exam General- Alert, Oriented, Affect-appropriate, Distress- none acute Skin- rash-none, lesions- none, excoriation- none Lymphadenopathy- none Head- atraumatic            Eyes- Gross vision intact, PERRLA, +conjunctivae mildly injected,  secretions clear            Ears- Hearing, canals-normal            Nose- Clear, no-Septal dev, mucus, polyps, erosion, perforation             Throat- Mallampati II , mucosa clear , drainage- none, tonsils- atrophic Neck- flexible , trachea midline, no stridor , thyroid nl, carotid no bruit Chest - symmetrical excursion , unlabored           Heart/CV- RRR , no murmur , no gallop  , no rub, nl s1 s2                           - JVD- none , edema- none, stasis changes- none, varices- none           Lung- clear to P&A, wheeze- none, +bronchitic cough , dullness-none, rub- none           Chest wall-  Abd- tender-no, distended-no, bowel sounds-present, HSM- no Br/ Gen/ Rectal- Not done, not indicated Extrem- cyanosis- none, clubbing, none, atrophy- none, strength- nl Neuro- grossly intact to observation

## 2012-02-23 ENCOUNTER — Encounter: Payer: Self-pay | Admitting: Internal Medicine

## 2012-02-23 DIAGNOSIS — J441 Chronic obstructive pulmonary disease with (acute) exacerbation: Secondary | ICD-10-CM | POA: Insufficient documentation

## 2012-02-23 NOTE — Assessment & Plan Note (Signed)
Not obviously related to rhinosinusitis. With any question of his Wegener's disease being recurrent, we will be quick to image his sinuses if he doesn't respond to therapy.

## 2012-02-23 NOTE — Assessment & Plan Note (Signed)
We discussed options carefully. We have chosen to try a short prednisone burst and taper. If he gets a sustained symptomatic response we will just observe. If he improves only temporarily or has no response we will be looking first for possible recurrent granulomatous disease with angiitis.

## 2012-02-26 ENCOUNTER — Telehealth: Payer: Self-pay | Admitting: Internal Medicine

## 2012-02-26 MED ORDER — PREDNISONE 10 MG PO TABS: 10.0000 mg | ORAL_TABLET | Freq: Every day | ORAL | Status: DC

## 2012-02-26 NOTE — Telephone Encounter (Signed)
I spoke with pt and is aware of CDY recs. RX has been sent to the pharmacy

## 2012-02-26 NOTE — Telephone Encounter (Signed)
I spoke with pt and he states when last saw CDY on 02/15/12 he was given a pred taper and all his symptoms went away. For a little over a week he has began coughing in the AM, PND, facial pressure, and the headaches have came back. He has been taking tylenol every 4 hours. Pt is requesting further recs. Please advise Dy. Young thanks  Allergies  Allergen Reactions  . Sulfa Antibiotics Hives

## 2012-02-26 NOTE — Telephone Encounter (Signed)
Per CY-okay to give Prednisone 10 mg #30 take 1 po qd no refills.

## 2012-03-25 ENCOUNTER — Other Ambulatory Visit: Payer: Self-pay | Admitting: Internal Medicine

## 2012-03-27 ENCOUNTER — Telehealth: Payer: Self-pay | Admitting: Internal Medicine

## 2012-03-27 NOTE — Telephone Encounter (Signed)
LMOMTCB regarding prednisone.  CY approved #30 tablets on prednisone 10mg  once daily with no refills on 02-26-12.  Is pt still having symptoms?

## 2012-03-27 NOTE — Telephone Encounter (Signed)
Pt returned call.  Pt stated that he is still having headaches & that while prednisone doesn't take the headache completely away, it does minimize it greatly.  Pt stated he is totally out of prednisone & would like an ASAP refill, please.  Pt can be reached at [phone].  Antionette Fairy

## 2012-03-29 MED ORDER — PREDNISONE 10 MG PO TABS: 10.0000 mg | ORAL_TABLET | Freq: Every day | ORAL | Status: DC

## 2012-03-29 NOTE — Telephone Encounter (Signed)
RX called to [facility].

## 2012-03-29 NOTE — Telephone Encounter (Signed)
I spoke with the patient and he states he was wanting a refill on prednisone 10mg . He states he was given a rx for this to try and see if it helps his symptoms. The pt states his cough, and nasal congestion is improving, but his headaches are still bad unless he is on prednisone and this is why he is asking for a refill. Refill was actually sent on 03-27-12. It is not clear if Dr. Maple Hudson wanted the pt to continue the prednisone past the one rx. Please advise if pt is to continue. Carron Curie, CMA

## 2012-03-29 NOTE — Telephone Encounter (Signed)
Order prednisone 10 mg, # 30, 1 daily, refill x 1.

## 2012-04-10 ENCOUNTER — Ambulatory Visit (INDEPENDENT_AMBULATORY_CARE_PROVIDER_SITE_OTHER)
Admission: RE | Admit: 2012-04-10 | Discharge: 2012-04-10 | Disposition: A | Discharge status: Home / Self Care | Payer: BC Managed Care – PPO | Source: Ambulatory Visit | Attending: Internal Medicine | Admitting: Internal Medicine

## 2012-04-10 ENCOUNTER — Encounter: Payer: Self-pay | Admitting: Internal Medicine

## 2012-04-10 ENCOUNTER — Ambulatory Visit (INDEPENDENT_AMBULATORY_CARE_PROVIDER_SITE_OTHER): Payer: BC Managed Care – PPO | Admitting: Internal Medicine

## 2012-04-10 ENCOUNTER — Other Ambulatory Visit (INDEPENDENT_AMBULATORY_CARE_PROVIDER_SITE_OTHER): Payer: BC Managed Care – PPO

## 2012-04-10 VITALS — BP 144/90 | HR 65 | Ht 70.0 in | Wt 188.2 lb

## 2012-04-10 DIAGNOSIS — R51 Headache: Secondary | ICD-10-CM

## 2012-04-10 DIAGNOSIS — M3130 Wegener's granulomatosis without renal involvement: Secondary | ICD-10-CM

## 2012-04-10 DIAGNOSIS — J441 Chronic obstructive pulmonary disease with (acute) exacerbation: Secondary | ICD-10-CM

## 2012-04-10 LAB — CBC WITH DIFFERENTIAL/PLATELET
Basophils Absolute: 0 10*3/uL (ref 0.0–0.1)
Eosinophils Absolute: 0.1 10*3/uL (ref 0.0–0.7)
HCT: 34.3 % — ABNORMAL LOW (ref 39.0–52.0)
Hemoglobin: 11.3 g/dL — ABNORMAL LOW (ref 13.0–17.0)
Lymphs Abs: 1 10*3/uL (ref 0.7–4.0)
MCHC: 32.8 g/dL (ref 30.0–36.0)
MCV: 83.9 fl (ref 78.0–100.0)
Monocytes Absolute: 0.4 10*3/uL (ref 0.1–1.0)
Neutro Abs: 7.4 10*3/uL (ref 1.4–7.7)
Platelets: 521 10*3/uL — ABNORMAL HIGH (ref 150.0–400.0)
RDW: 14.7 % — ABNORMAL HIGH (ref 11.5–14.6)

## 2012-04-10 LAB — BASIC METABOLIC PANEL
BUN: 24 mg/dL — ABNORMAL HIGH (ref 6–23)
Calcium: 9.4 mg/dL (ref 8.4–10.5)
Creatinine, Ser: 1.4 mg/dL (ref 0.4–1.5)

## 2012-04-10 LAB — SEDIMENTATION RATE: Sed Rate: 110 mm/hr — ABNORMAL HIGH (ref 0–22)

## 2012-04-10 MED ORDER — METHYLPREDNISOLONE ACETATE 80 MG/ML IJ SUSP
80.0000 mg | Freq: Once | INTRAMUSCULAR | Status: AC
  Administered 2012-04-10: 80 mg via INTRAMUSCULAR

## 2012-04-10 NOTE — Patient Instructions (Addendum)
Order- lab- CBC w/ diff, Sed rate, BMET, ANCA/ antinuclear cytoplasma antibody  P&C      Dx Wegeners granulomatosis             Limited CT scan of sinuses             CXR  Depo 80

## 2012-04-10 NOTE — Progress Notes (Signed)
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 In 1997 he was diagnosed by open lung biopsy with Wegener's granulomatosis and treated successfully with prednisone and Cytoxan. He comes now, asking allergy evaluation because of persistent cough. He describes morning cough or years. He got temporary relief from Depo-Medrol. He describes aching all over, frontal headaches which have been going on for 3 or 4 weeks. He did have a history of seasonal allergic rhinitis in the springtime and had been on allergy vaccine until his lung biopsy in 1997. Claritin helps his postnasal drainage some. A chest x-ray was reported clear. He denies fever, adenopathy, blood, shortness of breath or problem with his kidneys. Sputum is clear and thick. Many years ago he was diagnosed with obstructive sleep apnea but that resolved with sustained weight loss. He is a widower, wife died of lung cancer probably metastatic from breast. He teaches shop.  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 In 1997 he was diagnosed by open lung biopsy with Wegener's granulomatosis and treated successfully with prednisone and Cytoxan. Cough has gotten better but still having frontl headches-up and over head. We had given a prednisone taper from 40 mg over 8 days and last visit. He felt well at 40 and 30 mg, but as he got back down to 10 mg his headache returned along with mild cough. He denies nasal discharge or sinus pain. He is continuing maintenance prednisone 10 mg daily together with Tylenol to control headache. Since last here he has gotten remarried.  ROS-see HPI Constitutional:   No-   weight loss, night sweats, fevers, chills, fatigue, lassitude. HEENT:   +  headaches, No-difficulty swallowing, tooth/dental problems, sore throat,       No-  sneezing, itching, ear ache, nasal congestion, post nasal drip,  CV:  No-   chest pain, orthopnea, PND,  swelling in lower extremities, anasarca, dizziness, palpitations Resp: No-   shortness of breath with exertion or at rest.              No- productive cough,  + non-productive cough,  No- coughing up of blood.              No-   change in color of mucus.  No- wheezing.   Skin: No-   rash or lesions. GI:  No-   heartburn, indigestion, abdominal pain, nausea, vomiting,  GU:  MS:  No-   joint pain or swelling.  Neuro-     nothing unusual Psych:  No- change in mood or affect. No depression or anxiety.  No memory loss.  OBJ- Physical Exam General- Alert, Oriented, Affect-appropriate, Distress- none acute Skin- rash-none, lesions- none, excoriation- none Lymphadenopathy- none Head- atraumatic            Eyes- Gross vision intact, PERRLA, +conjunctivae mildly injected,  secretions clear            Ears- Hearing, canals-normal            Nose- Clear, no-Septal dev, mucus, polyps, erosion, perforation             Throat- Mallampati II , mucosa clear , drainage- none, tonsils- atrophic Neck- flexible , trachea midline, no stridor , thyroid nl, carotid no bruit Chest - symmetrical excursion , unlabored           Heart/CV- RRR , no murmur , no gallop  , no rub, nl s1  s2                           - JVD- none , edema- none, stasis changes- none, varices- none           Lung- clear to P&A, wheeze- none, +bronchitic cough , dullness-none, rub- none           Chest wall-  Abd-  Br/ Gen/ Rectal- Not done, not indicated Extrem- cyanosis- none, clubbing, none, atrophy- none, strength- nl Neuro- grossly intact to observation

## 2012-04-11 NOTE — Progress Notes (Signed)
Quick Note:  LMTCB ______ 

## 2012-04-11 NOTE — Progress Notes (Signed)
Quick Note:  Pt aware of results. ______ 

## 2012-04-12 LAB — ANCA SCREEN W REFLEX TITER
c-ANCA Screen: NEGATIVE
p-ANCA Screen: POSITIVE — AB

## 2012-04-12 LAB — ANCA TITERS: P-ANCA: 1:160 {titer} — ABNORMAL HIGH

## 2012-04-15 NOTE — Assessment & Plan Note (Signed)
Suspect recurrence of Wegener's.  Plan limited CT sinuses.

## 2012-04-15 NOTE — Assessment & Plan Note (Signed)
Cough and headache responded to moderate doses of prednisone. I think we need to be concerned this is a recurrence of hie granulomatous inflammation/ Wegener's after 20 years.  Plan- continue prednisone 10 mg daily. Lab- CBC, Sed rate, ANCA, BMET. If labs return c/w Wegener's we will refer to Rheumatology.

## 2012-04-16 ENCOUNTER — Telehealth: Payer: Self-pay | Admitting: Internal Medicine

## 2012-04-16 NOTE — Telephone Encounter (Signed)
ANCA supports impression of Wegener's. I notiifed him. He has an appointment with Rheumatology/ Dr Nickola Major. I will cc her last ov note with labs.

## 2012-04-24 ENCOUNTER — Other Ambulatory Visit: Payer: Self-pay | Admitting: Rheumatology

## 2012-04-24 DIAGNOSIS — M3130 Wegener's granulomatosis without renal involvement: Secondary | ICD-10-CM

## 2012-04-25 ENCOUNTER — Ambulatory Visit
Admission: RE | Admit: 2012-04-25 | Discharge: 2012-04-25 | Disposition: A | Discharge status: Home / Self Care | Payer: BC Managed Care – PPO | Source: Ambulatory Visit | Attending: Rheumatology | Admitting: Rheumatology

## 2012-04-25 DIAGNOSIS — M3130 Wegener's granulomatosis without renal involvement: Secondary | ICD-10-CM

## 2012-05-13 ENCOUNTER — Telehealth: Payer: Self-pay | Admitting: Internal Medicine

## 2012-05-13 ENCOUNTER — Ambulatory Visit: Payer: BC Managed Care – PPO | Admitting: Internal Medicine

## 2012-05-13 NOTE — Telephone Encounter (Signed)
Pt wants to know if he really needs to keep his appt with [facility] this afternoon since he has been referred to Dr. Nickola Major. He does not want to pay an additional copay if it isn't necessary. Pls advise. Pt last seen on 04/10/2012. Today was going to be a 1 month follow-up.

## 2012-05-13 NOTE — Telephone Encounter (Signed)
Per CY-okay to cancel OV today but would like to have patient come by and do FeNO test(free to patient) and no OV needed. Pt is aware and states he can come this week late in the afternoon to do this test. Pt will call me and let me know what day and time he will come by for me to perform the test.

## 2012-05-29 ENCOUNTER — Other Ambulatory Visit (HOSPITAL_COMMUNITY): Payer: Self-pay | Admitting: *Deleted

## 2012-05-31 ENCOUNTER — Encounter (HOSPITAL_COMMUNITY)
Admission: RE | Admit: 2012-05-31 | Discharge: 2012-05-31 | Disposition: A | Discharge status: Home / Self Care | Payer: BC Managed Care – PPO | Source: Ambulatory Visit | Attending: Rheumatology | Admitting: Rheumatology

## 2012-05-31 ENCOUNTER — Other Ambulatory Visit (HOSPITAL_COMMUNITY): Payer: Self-pay | Admitting: *Deleted

## 2012-05-31 ENCOUNTER — Encounter (HOSPITAL_COMMUNITY): Payer: Self-pay

## 2012-05-31 DIAGNOSIS — M3130 Wegener's granulomatosis without renal involvement: Secondary | ICD-10-CM | POA: Insufficient documentation

## 2012-05-31 LAB — BASIC METABOLIC PANEL
BUN: 36 mg/dL — ABNORMAL HIGH (ref 6–23)
CO2: 27 mEq/L (ref 19–32)
Chloride: 99 mEq/L (ref 96–112)
GFR calc Af Amer: 69 mL/min — ABNORMAL LOW (ref >90)
Potassium: 4.3 mEq/L (ref 3.5–5.1)

## 2012-05-31 LAB — CBC WITH DIFFERENTIAL/PLATELET
HCT: 39.7 % (ref 39.0–52.0)
Hemoglobin: 13.3 g/dL (ref 13.0–17.0)
Lymphocytes Relative: 10 % — ABNORMAL LOW (ref 12–46)
MCV: 83.9 fL (ref 78.0–100.0)
Monocytes Absolute: 0.4 10*3/uL (ref 0.1–1.0)
Monocytes Relative: 5 % (ref 3–12)
Neutro Abs: 6.6 10*3/uL (ref 1.7–7.7)
WBC: 7.7 10*3/uL (ref 4.0–10.5)

## 2012-05-31 MED ORDER — SODIUM CHLORIDE 0.9 % IV SOLN
Freq: Every day | INTRAVENOUS | Status: DC
  Administered 2012-05-31: 13:00:00 via INTRAVENOUS

## 2012-05-31 NOTE — Progress Notes (Signed)
After another 15 minutes checked BP again right arm 198/95 and left 207/99 I called and reported this to Dr Nickola Major. Her recommendation was to cancel Rituxan infusion today and have pt go to PCP or ED . Informed pt of this and he was disappointed that he would not get Rituxan today but would prefer to see his PCP . I Called Dr Fredirick Maudlin office and spoke with triage nurse Efraim Kaufmann and pt is scheduled for appointment at 345 pm today . Pt was discharged and is going to the office at this time

## 2012-05-31 NOTE — Progress Notes (Signed)
Pt arrives here today for 1st infusion of Rituxin. BP is elevated.(see Doc Flowsheet) allowed pt to recline for approx 15 minutes and rechecked bp each arm with a different monitor, still elevated. Allowed pt to relax 15 more minutes and still 205/97. Pt denies any c/o except concer about his BP because" this is not typical for me" Spoke with Dr Nickola Major nurse and reported this to her who spoke with Dr Nickola Major.  I am to reevaluate pt in another 15 minutes. Upon further discussion with patient about any stress or concerns that he might have he states he is recently remarried, trying to get some renovations on his house completed, scheduling his Rituxan appointments around his work schedule and some family celebration/ obligations.....so" yes I guess I do have some stress"

## 2012-06-07 ENCOUNTER — Encounter (HOSPITAL_COMMUNITY)
Admission: RE | Admit: 2012-06-07 | Discharge: 2012-06-07 | Disposition: A | Discharge status: Home / Self Care | Payer: BC Managed Care – PPO | Source: Ambulatory Visit | Attending: Rheumatology | Admitting: Rheumatology

## 2012-06-07 DIAGNOSIS — M3130 Wegener's granulomatosis without renal involvement: Secondary | ICD-10-CM | POA: Insufficient documentation

## 2012-06-07 NOTE — Progress Notes (Signed)
Spoke w Jacob Hong RN in Dr Nickola Major office re: increased BP. She doesn't want to infuse treatment today. Wants him to go to ER  Or see PCP today and to keep his 1215 appt 06-13-12. Patient extremely upset but verbalized understanding

## 2012-06-14 ENCOUNTER — Encounter (HOSPITAL_COMMUNITY)
Admission: RE | Admit: 2012-06-14 | Discharge: 2012-06-14 | Disposition: A | Discharge status: Home / Self Care | Payer: BC Managed Care – PPO | Source: Ambulatory Visit | Attending: Rheumatology | Admitting: Rheumatology

## 2012-06-14 ENCOUNTER — Encounter (HOSPITAL_COMMUNITY): Payer: Self-pay

## 2012-06-14 MED ORDER — SODIUM CHLORIDE 0.9 % IV SOLN
Freq: Every day | INTRAVENOUS | Status: DC
  Administered 2012-06-14: 13:00:00 via INTRAVENOUS

## 2012-06-14 MED ORDER — SODIUM CHLORIDE 0.9 % IV SOLN
375.0000 mg/m2 | Freq: Once | INTRAVENOUS | Status: AC
  Administered 2012-06-14: 800 mg via INTRAVENOUS
  Filled 2012-06-14: qty 80

## 2012-06-14 MED ORDER — ACETAMINOPHEN 500 MG PO TABS
1000.0000 mg | ORAL_TABLET | Freq: Every day | ORAL | Status: DC
  Administered 2012-06-14: 1000 mg via ORAL
  Filled 2012-06-14: qty 1

## 2012-06-14 MED ORDER — DIPHENHYDRAMINE HCL 25 MG PO TABS
25.0000 mg | ORAL_TABLET | Freq: Every day | ORAL | Status: DC
  Administered 2012-06-14: 25 mg via ORAL
  Filled 2012-06-14 (×3): qty 1

## 2012-06-14 NOTE — Progress Notes (Signed)
Pt arrived for RITUXAN infusion today and BP 141/81 and meet parameters as stated by Dr Nickola Major for pt to receive it today. Pt is very eager to start infusion

## 2012-06-14 NOTE — Progress Notes (Signed)
Patient's Rituxan infusion completed at 1840 without complication. VS 140/85 and HR 91. No s/s of any reactions. Tolerated well. RN d/c patient's iv. Catheter intact and site u. One drop of blood came out of iv site and pressure with gauze pad held. No more bleeding. Site u. Patient sitting in recliner and stated "I am feeling dizzy". Reclined patient back and BP 73/40 HR 71. Patient talking to RN and stated that blood and needles make him feel this way. Stated he used to donate blood for years but had to stop due to feeling faint due to blood/needles. Patient monitored closely and was able to talk to RN the whole time. VS at 1850 were 103/65 and HR 72. Had patient rest in recliner until he felt better. 1925 VS 109/65 and HR 70. Stood up from recliner and stated "I feel fine and am ready to go home". Escorted patient to car. Patient to call MD with any follow up problems or questions.

## 2012-06-14 NOTE — Progress Notes (Signed)
Called Dr Nickola Major to discuss pt's 06/13/12 app't with her. Pt is on two BP meds ordered by Dr Clarene Duke. Prednisone dosage has been decreased. Dr Nickola Major states that if systolic ,180 and diastolic <100,  okay to proceed with rituxan today.

## 2012-06-21 ENCOUNTER — Encounter (HOSPITAL_COMMUNITY): Payer: Self-pay

## 2012-06-21 ENCOUNTER — Encounter (HOSPITAL_COMMUNITY)
Admission: RE | Admit: 2012-06-21 | Discharge: 2012-06-21 | Disposition: A | Discharge status: Home / Self Care | Payer: BC Managed Care – PPO | Source: Ambulatory Visit | Attending: Rheumatology | Admitting: Rheumatology

## 2012-06-21 MED ORDER — ACETAMINOPHEN 500 MG PO TABS
1000.0000 mg | ORAL_TABLET | Freq: Every day | ORAL | Status: DC
  Administered 2012-06-21: 1000 mg via ORAL
  Filled 2012-06-21: qty 2

## 2012-06-21 MED ORDER — DIPHENHYDRAMINE HCL 25 MG PO TABS
25.0000 mg | ORAL_TABLET | Freq: Every day | ORAL | Status: DC
  Administered 2012-06-21: 25 mg via ORAL
  Filled 2012-06-21 (×3): qty 1

## 2012-06-21 MED ORDER — SODIUM CHLORIDE 0.9 % IV SOLN
Freq: Every day | INTRAVENOUS | Status: DC
  Administered 2012-06-21: 14:00:00 via INTRAVENOUS

## 2012-06-21 MED ORDER — SODIUM CHLORIDE 0.9 % IV SOLN
375.0000 mg/m2 | Freq: Once | INTRAVENOUS | Status: AC
  Administered 2012-06-21: 800 mg via INTRAVENOUS
  Filled 2012-06-21: qty 80

## 2012-06-28 ENCOUNTER — Encounter (HOSPITAL_COMMUNITY): Payer: Self-pay

## 2012-06-28 ENCOUNTER — Encounter (HOSPITAL_COMMUNITY)
Admission: RE | Admit: 2012-06-28 | Discharge: 2012-06-28 | Disposition: A | Discharge status: Home / Self Care | Payer: BC Managed Care – PPO | Source: Ambulatory Visit | Attending: Rheumatology | Admitting: Rheumatology

## 2012-06-28 MED ORDER — DIPHENHYDRAMINE HCL 25 MG PO TABS
25.0000 mg | ORAL_TABLET | Freq: Every day | ORAL | Status: DC
  Administered 2012-06-28: 25 mg via ORAL
  Filled 2012-06-28 (×3): qty 1

## 2012-06-28 MED ORDER — SODIUM CHLORIDE 0.9 % IV SOLN
Freq: Every day | INTRAVENOUS | Status: DC
  Administered 2012-06-28: 14:00:00 via INTRAVENOUS

## 2012-06-28 MED ORDER — ACETAMINOPHEN 500 MG PO TABS
1000.0000 mg | ORAL_TABLET | Freq: Every day | ORAL | Status: DC
  Administered 2012-06-28: 1000 mg via ORAL
  Filled 2012-06-28: qty 2

## 2012-06-28 MED ORDER — SODIUM CHLORIDE 0.9 % IV SOLN
375.0000 mg/m2 | Freq: Once | INTRAVENOUS | Status: AC
  Administered 2012-06-28: 800 mg via INTRAVENOUS
  Filled 2012-06-28: qty 80

## 2012-07-05 ENCOUNTER — Encounter (HOSPITAL_COMMUNITY)
Admission: RE | Admit: 2012-07-05 | Discharge: 2012-07-05 | Disposition: A | Discharge status: Home / Self Care | Payer: BC Managed Care – PPO | Source: Ambulatory Visit | Attending: Rheumatology | Admitting: Rheumatology

## 2012-07-05 ENCOUNTER — Encounter (HOSPITAL_COMMUNITY): Payer: Self-pay

## 2012-07-05 DIAGNOSIS — M3130 Wegener's granulomatosis without renal involvement: Secondary | ICD-10-CM | POA: Insufficient documentation

## 2012-07-05 MED ORDER — DIPHENHYDRAMINE HCL 25 MG PO CAPS
25.0000 mg | ORAL_CAPSULE | Freq: Once | ORAL | Status: AC
  Administered 2012-07-05: 25 mg via ORAL

## 2012-07-05 MED ORDER — SODIUM CHLORIDE 0.9 % IV SOLN
375.0000 mg/m2 | Freq: Once | INTRAVENOUS | Status: AC
  Administered 2012-07-05: 800 mg via INTRAVENOUS
  Filled 2012-07-05: qty 80

## 2012-07-05 MED ORDER — DIPHENHYDRAMINE HCL 25 MG PO TABS
25.0000 mg | ORAL_TABLET | Freq: Every day | ORAL | Status: DC
  Filled 2012-07-05 (×3): qty 1

## 2012-07-05 MED ORDER — ACETAMINOPHEN 500 MG PO TABS
1000.0000 mg | ORAL_TABLET | Freq: Every day | ORAL | Status: DC
  Administered 2012-07-05: 1000 mg via ORAL
  Filled 2012-07-05: qty 2

## 2012-07-05 MED ORDER — SODIUM CHLORIDE 0.9 % IV SOLN
Freq: Every day | INTRAVENOUS | Status: DC
  Administered 2012-07-05: 250 mL via INTRAVENOUS

## 2012-07-07 ENCOUNTER — Encounter (HOSPITAL_COMMUNITY): Payer: Self-pay | Admitting: Emergency Medicine

## 2012-07-07 ENCOUNTER — Emergency Department (HOSPITAL_COMMUNITY)
Admission: EM | Admit: 2012-07-07 | Discharge: 2012-07-08 | Disposition: A | Discharge status: Home / Self Care | Payer: BC Managed Care – PPO | Attending: Emergency Medicine | Admitting: Emergency Medicine

## 2012-07-07 ENCOUNTER — Emergency Department (HOSPITAL_COMMUNITY): Payer: BC Managed Care – PPO

## 2012-07-07 DIAGNOSIS — G4730 Sleep apnea, unspecified: Secondary | ICD-10-CM | POA: Insufficient documentation

## 2012-07-07 DIAGNOSIS — IMO0002 Reserved for concepts with insufficient information to code with codable children: Secondary | ICD-10-CM | POA: Insufficient documentation

## 2012-07-07 DIAGNOSIS — R112 Nausea with vomiting, unspecified: Secondary | ICD-10-CM | POA: Insufficient documentation

## 2012-07-07 DIAGNOSIS — Z9884 Bariatric surgery status: Secondary | ICD-10-CM | POA: Insufficient documentation

## 2012-07-07 DIAGNOSIS — Z79899 Other long term (current) drug therapy: Secondary | ICD-10-CM | POA: Insufficient documentation

## 2012-07-07 DIAGNOSIS — R002 Palpitations: Secondary | ICD-10-CM | POA: Insufficient documentation

## 2012-07-07 DIAGNOSIS — R109 Unspecified abdominal pain: Secondary | ICD-10-CM

## 2012-07-07 DIAGNOSIS — I498 Other specified cardiac arrhythmias: Secondary | ICD-10-CM | POA: Insufficient documentation

## 2012-07-07 LAB — BASIC METABOLIC PANEL
CO2: 26 mEq/L (ref 19–32)
Calcium: 9.3 mg/dL (ref 8.4–10.5)
Creatinine, Ser: 1.45 mg/dL — ABNORMAL HIGH (ref 0.50–1.35)
GFR calc Af Amer: 58 mL/min — ABNORMAL LOW (ref >90)
Sodium: 139 mEq/L (ref 135–145)

## 2012-07-07 LAB — CBC
HCT: 40.2 % (ref 39.0–52.0)
Hemoglobin: 13.7 g/dL (ref 13.0–17.0)
MCH: 29.3 pg (ref 26.0–34.0)
MCHC: 34.1 g/dL (ref 30.0–36.0)
MCV: 85.9 fL (ref 78.0–100.0)
Platelets: 266 10*3/uL (ref 150–400)
RBC: 4.68 MIL/uL (ref 4.22–5.81)
RDW: 16.1 % — ABNORMAL HIGH (ref 11.5–15.5)
WBC: 10.3 10*3/uL (ref 4.0–10.5)

## 2012-07-07 LAB — HEPATIC FUNCTION PANEL
Albumin: 3.6 g/dL (ref 3.5–5.2)
Alkaline Phosphatase: 88 U/L (ref 39–117)
Indirect Bilirubin: 0.3 mg/dL (ref 0.3–0.9)
Total Protein: 6.5 g/dL (ref 6.0–8.3)

## 2012-07-07 LAB — GLUCOSE, CAPILLARY: Glucose-Capillary: 86 mg/dL (ref 70–99)

## 2012-07-07 LAB — LIPASE, BLOOD: Lipase: 101 U/L — ABNORMAL HIGH (ref 11–59)

## 2012-07-07 MED ORDER — ONDANSETRON 8 MG PO TBDP: 8.0000 mg | ORAL_TABLET | Freq: Three times a day (TID) | ORAL | Status: DC | PRN

## 2012-07-07 MED ORDER — PROMETHAZINE HCL 25 MG/ML IJ SOLN
25.0000 mg | Freq: Once | INTRAMUSCULAR | Status: AC
  Administered 2012-07-07: 25 mg via INTRAVENOUS
  Filled 2012-07-07: qty 1

## 2012-07-07 MED ORDER — ONDANSETRON HCL 4 MG/2ML IJ SOLN
4.0000 mg | Freq: Once | INTRAMUSCULAR | Status: AC
Start: 2012-07-07 — End: 2012-07-07
  Administered 2012-07-07: 4 mg via INTRAVENOUS
  Filled 2012-07-07: qty 2

## 2012-07-07 MED ORDER — ASPIRIN 325 MG PO TABS
325.0000 mg | ORAL_TABLET | ORAL | Status: DC
  Filled 2012-07-07: qty 1

## 2012-07-07 NOTE — ED Provider Notes (Signed)
History     CSN: [id number]  Arrival date & time 07/07/12  1827   First MD Initiated Contact with Patient 07/07/12 1859      Chief Complaint  Patient presents with  . Chest Pain  . Palpitations  . Nausea  . Emesis    (Consider location/radiation/quality/duration/timing/severity/associated sxs/prior treatment) Patient is a 61 y.o. male presenting with chest pain, palpitations, and vomiting. The history is provided by the patient.  Chest Pain Primary symptoms include palpitations, abdominal pain, nausea and vomiting. Pertinent negatives for primary symptoms include no cough.    Palpitations  Associated symptoms include chest pain, abdominal pain, nausea and vomiting. Pertinent negatives include no back pain and no cough.  Emesis  Associated symptoms include abdominal pain. Pertinent negatives include no cough.   patient presents with lower chest or abdominal pain. He states he's been having episodes of somewhat after eating, however today he was more severe. He states his heart was racing. He has been on prednisone and other medicines for his Wegener's granulomatosis.. Throbbing pain with it. While being triaged reportedly stopped. He states he felt his heart going fast .   Past Medical History  Diagnosis Date  . Sleep apnea     states this is no longer a problem since weight loss  . Wegener's granulomatosis (granulomatosis with polyangiitis) 1997    Open lung biopsy. Prednisone/Cytoxan    Past Surgical History  Procedure Date  . Gastric bypass 2006  . Lung biopsy 1997    Family History  Problem Relation Age of Onset  . Cancer Father     History  Substance Use Topics  . Smoking status: Never Smoker   . Smokeless tobacco: Not on file  . Alcohol Use: 1.2 oz/week    2 Glasses of wine per week      Review of Systems  Constitutional: Negative for appetite change.  Respiratory: Negative for cough and choking.   Cardiovascular: Positive for chest pain and  palpitations.  Gastrointestinal: Positive for nausea, vomiting and abdominal pain.  Genitourinary: Negative for dysuria.  Musculoskeletal: Negative for back pain.    Allergies  Sulfa antibiotics and Gluten meal  Home Medications   Current Outpatient Rx  Name  Route  Sig  Dispense  Refill  . ALLOPURINOL 100 MG PO TABS   Oral   Take 2 tablets by mouth daily.         Marland Kitchen CALCIUM CITRATE + PO   Oral   Take 2 tablets by mouth daily.          . CHOLECALCIFEROL 400 UNITS PO TABS   Oral   Take 400 Units by mouth daily.         Marland Kitchen DOXAZOSIN MESYLATE 8 MG PO TABS   Oral   Take 1 tablet by mouth At bedtime.         . OMEGA-3 FATTY ACIDS 1000 MG PO CAPS   Oral   Take 2 g by mouth daily.          Marland Kitchen FOLIC ACID 400 MCG PO TABS   Oral   Take 400 mcg by mouth daily.         Marland Kitchen HYDROCHLOROTHIAZIDE 25 MG PO TABS   Oral   Take 25 mg by mouth daily.         Marland Kitchen LORATADINE 10 MG PO TABS   Oral   Take 10 mg by mouth daily.         Marland Kitchen LOSARTAN POTASSIUM 50 MG PO  TABS   Oral   Take 50 mg by mouth daily.         Marland Kitchen ONE-DAILY MULTI VITAMINS PO TABS   Oral   Take 1 tablet by mouth daily.         Marland Kitchen POTASSIUM CHLORIDE ER 10 MEQ PO TBCR   Oral   Take 20 mEq by mouth 2 (two) times daily.         Marland Kitchen PREDNISONE 20 MG PO TABS   Oral   Take 20 mg by mouth at bedtime.         Marland Kitchen VITAMIN B-12 1000 MCG PO TABS   Oral   Take 1,000 mcg by mouth daily.         Marland Kitchen ONDANSETRON 8 MG PO TBDP   Oral   Take 1 tablet (8 mg total) by mouth every 8 (eight) hours as needed for nausea.   20 tablet   0     BP 150/110  Pulse 124  Temp 98.1 F (36.7 C) (Oral)  Resp 18  SpO2 100%  Physical Exam  Nursing note and vitals reviewed. Constitutional: He is oriented to person, place, and time. He appears well-developed and well-nourished.  HENT:  Head: Normocephalic and atraumatic.  Eyes: EOM are normal. Pupils are equal, round, and reactive to light.  Neck: Normal range of  motion. Neck supple.  Cardiovascular: Normal rate, regular rhythm and normal heart sounds.   No murmur heard. Pulmonary/Chest: Effort normal and breath sounds normal.  Abdominal: Soft. Bowel sounds are normal. He exhibits no distension and no mass. There is tenderness. There is no rebound and no guarding.       Mild upper abdominal tenderness without rebound or guarding.  Musculoskeletal: Normal range of motion. He exhibits no edema.  Neurological: He is alert and oriented to person, place, and time. No cranial nerve deficit.  Skin: Skin is warm and dry.  Psychiatric: He has a normal mood and affect.    ED Course  Procedures (including critical care time)  Labs Reviewed  CBC - Abnormal; Notable for the following:    RDW 16.1 (*)     All other components within normal limits  BASIC METABOLIC PANEL - Abnormal; Notable for the following:    Potassium 3.3 (*)     Glucose, Bld 67 (*)     BUN 35 (*)     Creatinine, Ser 1.45 (*)     GFR calc non Af Amer 50 (*)     GFR calc Af Amer 58 (*)     All other components within normal limits  HEPATIC FUNCTION PANEL - Abnormal; Notable for the following:    AST 43 (*)     All other components within normal limits  LIPASE, BLOOD - Abnormal; Notable for the following:    Lipase 101 (*)     All other components within normal limits  GLUCOSE, CAPILLARY   Dg Chest 2 View  07/07/2012  *RADIOLOGY REPORT*  Clinical Data: Chest pain.  CHEST - 2 VIEW  Comparison: 04/10/2012.  Findings: The cardiac silhouette, mediastinal and hilar contours are within normal limits and stable.  The lungs are clear.  Stable surgical changes in the left lower lobe.  No pleural effusion.  The bony thorax is intact.  IMPRESSION:  1.  Stable surgical changes in the left lower lobe. 2.  No acute pulmonary findings.   Original Report Authenticated By: Rudie Meyer, M.D.    Ct Abdomen Pelvis W Contrast  07/07/2012  *  RADIOLOGY REPORT*  Clinical Data: Upper abdominal pain, nausea  and vomiting; history of gastric bypass and appendectomy.  CT ABDOMEN AND PELVIS WITH CONTRAST  Technique:  Multidetector CT imaging of the abdomen and pelvis was performed following the standard protocol during bolus administration of intravenous contrast.  Contrast:  100 mL of Omnipaque 300 IV contrast  Comparison: CT of the abdomen and pelvis performed 08/29/2007, and abdominal ultrasound performed 09/02/2007  Findings: The visualized lung bases are clear, aside from mild bronchiectasis at the right lung base.  Prominent coronary artery calcifications are seen.  Scattered stones are seen layering dependently within the gallbladder.  The gallbladder is mildly distended but grossly unremarkable in appearance.  The liver and spleen are grossly unremarkable.  The pancreas and adrenal glands are within normal limits.  Scattered bilateral renal cysts are seen, measuring up to 3.2 cm on the right and 2.6 cm on the left.  Mild nonspecific perinephric stranding is noted bilaterally, mildly more prominent on the right. There is no definite evidence for pyelonephritis; no renal or ureteral stones are seen.  No free fluid is identified.  The small bowel is unremarkable in appearance.  The patient is status post gastric bypass; the distal stomach and duodenum are decompressed and grossly unremarkable in appearance.  No acute vascular abnormalities are seen.  Scattered calcification is noted along the abdominal aorta and its branches.  The patient is status post appendectomy.  Scattered diverticulosis is noted along the transverse, descending and sigmoid colon, without evidence of diverticulitis.  The colon is otherwise unremarkable in appearance.  The bladder is mildly distended and grossly unremarkable.  The prostate is normal in appearance.  No inguinal lymphadenopathy is seen.  No acute osseous abnormalities are identified.  There is chronic narrowing of the intervertebral disc space at L4-L5.  IMPRESSION:  1.  No acute  abnormalities seen within the abdomen or pelvis; nonspecific perinephric stranding is mildly more prominent, without definite evidence for pyelonephritis.  2.  Scattered bilateral renal cysts again noted. 3.  Scattered diverticulosis along the transverse, descending and sigmoid colon, without evidence of diverticulitis. 4.  Cholelithiasis again noted, with mild gallbladder distension but no evidence of obstruction or cholecystitis. 5.  Prominent coronary artery calcifications again noted. 6.  Mild bronchiectasis at the right lung base.   Original Report Authenticated By: Tonia Ghent, M.D.      1. Abdominal pain   2. Nausea and vomiting   3. Atrial bigeminy      Date: 07/07/2012  Rate: 100  Rhythm: normal sinus rhythm  QRS Axis: normal  Intervals: normal  ST/T Wave abnormalities: normal  Conduction Disutrbances:none  Narrative Interpretation: early precordial R/S  Old EKG Reviewed: unchanged   Date: 07/07/2012  Rate: 78  Rhythm: supraventricular bigeminy  QRS Axis: normal  Intervals: normal  ST/T Wave abnormalities: normal  Conduction Disutrbances:none  Narrative Interpretation: early precordial R/S, Bigeminy is new  Old EKG Reviewed: changes noted     MDM  Patient with upper abdominal or lower chest pain. Began after eating. Laboratory was a elevated lipase at 100. CT scan was done and did not show a clear cause. He does have gallstones, however he is not tender in the right upper quadrant. She EKG does not show ischemic changes. He did have an episode of supraventricular bigeminy. He is tolerated some orals here. He has had a previous gastric bypass. He'll need followup with gastroenterology. I doubt cardiac cause of the symptoms. No clear cause for the elevated lipase  either. He appears well to be discharged home        Juliet Rude. Rubin Payor, MD 07/08/12 4782

## 2012-07-07 NOTE — ED Notes (Signed)
Pt c/o chest pain onset 1 hr ago.  Pt is pale, diaphoretic, nauseous, and dry heaving. Pt has hx of gastric bypass 7 years ago.  Pt has been on prednisone for wagners granulomatosis.  Chest pain came on suddenly and is described as a throbbing.  While triaging pt, pt looked surprised and said "it's gone! The pain has subsided!".  When pt came in, pt rated pain at 8/10.  Now pt rates pain 1/10.

## 2012-07-08 LAB — POCT I-STAT TROPONIN I: Troponin i, poc: 0.01 ng/mL (ref 0.00–0.08)

## 2012-07-08 NOTE — ED Notes (Signed)
Patient is alert and oriented x3.  He was given DC instructions and follow up visit instructions.  Patient gave verbal understanding.  He was DC ambulatory under his own power to home.  V/S stable.  He was not showing any signs of distress on DC 

## 2012-08-05 ENCOUNTER — Encounter: Payer: Self-pay | Admitting: Cardiology

## 2012-08-05 ENCOUNTER — Ambulatory Visit (INDEPENDENT_AMBULATORY_CARE_PROVIDER_SITE_OTHER): Payer: BC Managed Care – PPO | Admitting: Cardiology

## 2012-08-05 VITALS — BP 160/89 | HR 70 | Ht 69.0 in | Wt 214.7 lb

## 2012-08-05 DIAGNOSIS — I491 Atrial premature depolarization: Secondary | ICD-10-CM | POA: Insufficient documentation

## 2012-08-05 NOTE — Patient Instructions (Addendum)
The current medical regimen is effective;  continue present plan and medications.  Follow up as needed 

## 2012-08-05 NOTE — Progress Notes (Signed)
HPI The patient presents for evaluation of atrial ectopy. He relates a story of recent treatment for an acute exacerbation of his chronic Wegener's granulomatosis. He's had to be on Rituxan and higher doses of steroids. He thinks that because of this he's had some abdominal issues with a feeling of nausea and a need to vomit frequently after eating. He had this problem acutely one night in November and presented to the emergency room.  He was treated for his symptoms resolved. As his steroid dose has been tapered this problem seems to have resolved. However, in the emergency room that night he was noted to have some atrial bigeminy. He did not notice any palpitations that he has had no symptoms before that or since then. He has no prior cardiac history. He denies any presyncope or syncope. He denies any chest discomfort, neck or arm discomfort. He has had some weight gain because of the edema. 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 With this he's had no symptoms. He doesn't recall any prior cardiac workup.  Allergies  Allergen Reactions  . Sulfa Antibiotics Hives  . Gluten Meal Hives    Current Outpatient Prescriptions  Medication Sig Dispense Refill  . allopurinol (ZYLOPRIM) 100 MG tablet Take 2 tablets by mouth daily.      . Calcium Citrate-Vitamin D (CALCIUM CITRATE + PO) Take 2 tablets by mouth daily.       . cholecalciferol (VITAMIN D) 400 UNITS TABS Take 400 Units by mouth daily.      Marland Kitchen doxazosin (CARDURA) 8 MG tablet Take 1 tablet by mouth At bedtime.      . fish oil-omega-3 fatty acids 1000 MG capsule Take 2 g by mouth daily.       Marland Kitchen loratadine ([employer] ALLERGY RELIEF) 10 MG tablet Take 10 mg by mouth daily.      . Multiple Vitamin (MULTIVITAMIN) tablet Take 1 tablet by mouth daily.      . potassium chloride (K-DUR) 10 MEQ tablet Take 20 mEq by mouth 2 (two) times daily.      . predniSONE (DELTASONE) 20 MG tablet Take 20 mg by mouth at bedtime.        . vitamin B-12 (CYANOCOBALAMIN) 1000 MCG tablet Take 1,000 mcg by mouth daily.        Past Medical History  Diagnosis Date  . Sleep apnea     states this is no longer a problem since weight loss  . Wegener's granulomatosis (granulomatosis with polyangiitis) 1997    Open lung biopsy. Prednisone/Cytoxan    Past Surgical History  Procedure Date  . Gastric bypass 2006  . Lung biopsy 1997    Family History  Problem Relation Age of Onset  . Cancer Father     History   Social History  . Marital Status: Married    Spouse Name: N/A    Number of Children: N/A  . Years of Education: N/A   Occupational History  . Teacher    Social History Main Topics  . Smoking status: Never Smoker   . Smokeless tobacco: Not on file  . Alcohol Use: 1.2 oz/week    2 Glasses of wine per week  . Drug Use: No  . Sexually Active: Not on file   Other Topics Concern  . Not on file   Social History Narrative  . No narrative on file    ROS:  As stated in the HPI and negative for all  other systems.  PHYSICAL EXAM BP 160/89  Pulse 70  Ht 5\' 9"  (1.753 m)  Wt 214 lb 11.2 oz (97.387 kg)  BMI 31.71 kg/m2 GENERAL:  Well appearing HEENT:  Pupils equal round and reactive, fundi not visualized, oral mucosa unremarkable NECK:  No jugular venous distention, waveform within normal limits, carotid upstroke brisk and symmetric, no bruits, no thyromegaly LYMPHATICS:  No cervical, inguinal adenopathy LUNGS:  Clear to auscultation bilaterally BACK:  No CVA tenderness CHEST:  Unremarkable HEART:  PMI not displaced or sustained,S1 and S2 within normal limits, no S3, no S4, no clicks, no rubs, no murmurs ABD:  Flat, positive bowel sounds normal in frequency in pitch, no bruits, no rebound, no guarding, no midline pulsatile mass, no hepatomegaly, no splenomegaly EXT:  2 plus pulses throughout, no edema, no cyanosis no clubbing SKIN:  No rashes no nodules NEURO:  Cranial nerves II through XII grossly intact,  motor grossly intact throughout PSYCH:  Cognitively intact, oriented to person place and time   EKG:  Sinus rhythm, rate 100, axis within normal limits, intervals within normal limits, early transition in lead V2, no acute ST-T wave changes.  08/05/2012  ASSESSMENT AND PLAN  Atrial ectopy - The patient has had no symptoms related to this. I think this was related to the acute illness that evening. He otherwise has a normal exam. I don't think further workup is indicated and we discussed this.  Hypertension - His blood pressure is slightly elevated here. However, I reviewed other blood pressure readings and he reports his home readings to be normal. He thinks his pressures coming down as his steroid dose has been reduced. We discussed therapeutic lifestyle changes to include increased activity. He plans to lose some of the weight he has picked up.  No change in therapy is indicated.

## 2012-08-15 ENCOUNTER — Other Ambulatory Visit: Payer: Self-pay

## 2013-02-11 ENCOUNTER — Other Ambulatory Visit: Payer: Self-pay

## 2013-09-25 ENCOUNTER — Encounter (HOSPITAL_COMMUNITY): Payer: Self-pay | Admitting: Emergency Medicine

## 2013-09-25 ENCOUNTER — Emergency Department (HOSPITAL_COMMUNITY)
Admission: EM | Admit: 2013-09-25 | Discharge: 2013-09-26 | Disposition: A | Discharge status: Home / Self Care | Payer: BC Managed Care – PPO | Attending: Emergency Medicine | Admitting: Emergency Medicine

## 2013-09-25 DIAGNOSIS — Z79899 Other long term (current) drug therapy: Secondary | ICD-10-CM | POA: Insufficient documentation

## 2013-09-25 DIAGNOSIS — J36 Peritonsillar abscess: Secondary | ICD-10-CM | POA: Insufficient documentation

## 2013-09-25 DIAGNOSIS — Z8679 Personal history of other diseases of the circulatory system: Secondary | ICD-10-CM | POA: Insufficient documentation

## 2013-09-25 LAB — POCT I-STAT, CHEM 8
BUN: 21 mg/dL (ref 6–23)
CALCIUM ION: 1.17 mmol/L (ref 1.13–1.30)
Chloride: 101 mEq/L (ref 96–112)
Creatinine, Ser: 1.5 mg/dL — ABNORMAL HIGH (ref 0.50–1.35)
GLUCOSE: 99 mg/dL (ref 70–99)
HCT: 44 % (ref 39.0–52.0)
HEMOGLOBIN: 15 g/dL (ref 13.0–17.0)
Potassium: 4.4 mEq/L (ref 3.7–5.3)
Sodium: 139 mEq/L (ref 137–147)
TCO2: 28 mmol/L (ref 0–100)

## 2013-09-25 MED ORDER — SODIUM CHLORIDE 0.9 % IV BOLUS (SEPSIS)
1000.0000 mL | Freq: Once | INTRAVENOUS | Status: AC
Start: 2013-09-25 — End: 2013-09-26
  Administered 2013-09-25: 1000 mL via INTRAVENOUS

## 2013-09-25 MED ORDER — CLINDAMYCIN PHOSPHATE 600 MG/50ML IV SOLN
600.0000 mg | Freq: Once | INTRAVENOUS | Status: AC
  Administered 2013-09-26: 600 mg via INTRAVENOUS
  Filled 2013-09-25: qty 50

## 2013-09-25 MED ORDER — DEXAMETHASONE SODIUM PHOSPHATE 10 MG/ML IJ SOLN
10.0000 mg | Freq: Once | INTRAMUSCULAR | Status: AC
  Administered 2013-09-25: 10 mg via INTRAVENOUS
  Filled 2013-09-25: qty 1

## 2013-09-25 MED ORDER — METHYLPREDNISOLONE SODIUM SUCC 125 MG IJ SOLR
125.0000 mg | Freq: Once | INTRAMUSCULAR | Status: DC
  Filled 2013-09-25: qty 2

## 2013-09-25 NOTE — ED Provider Notes (Signed)
CSN: [id number]     Arrival date & time 09/25/13  2051 History   First MD Initiated Contact with Patient 09/25/13 2313     Chief Complaint  Patient presents with  . Abscess   (Consider location/radiation/quality/duration/timing/severity/associated sxs/prior Treatment) HPI Comments: Patient presents to the ED with a chief complaint of sore throat.  He states that his throat has been painful for the past week or so.  It recently worsened in the past 2 days.  He was seen at the [facility] clinic and was told to come to the ED for a peritonsillar abscess.  He states that he has not been able to swallow any food for 2 days.  He states that the pain is relieved with toradol, which was given at the [facility].  Denies any kidney problems.  Reports a history of RA.  No other health problems.  Patient speaks with muffled voice.  The history is provided by the patient. No language interpreter was used.    Past Medical History  Diagnosis Date  . Sleep apnea     states this is no longer a problem since weight loss  . Wegener's granulomatosis (granulomatosis with polyangiitis) 1997    Open lung biopsy. Prednisone/Cytoxan   Past Surgical History  Procedure Laterality Date  . Gastric bypass  2006  . Lung biopsy  1997  . Appendectomy    . Knee surgery     Family History  Problem Relation Age of Onset  . Cancer Father    History  Substance Use Topics  . Smoking status: Never Smoker   . Smokeless tobacco: Not on file  . Alcohol Use: 1.2 oz/week    2 Glasses of wine per week    Review of Systems  All other systems reviewed and are negative.    Allergies  Sulfa antibiotics and Gluten meal  Home Medications   Current Outpatient Rx  Name  Route  Sig  Dispense  Refill  . allopurinol (ZYLOPRIM) 100 MG tablet   Oral   Take 2 tablets by mouth daily.         . Calcium Citrate-Vitamin D (CALCIUM CITRATE + PO)   Oral   Take 2 tablets by mouth daily.          . cholecalciferol (VITAMIN  D) 400 UNITS TABS   Oral   Take 400 Units by mouth daily.         . diphenhydramine-acetaminophen (TYLENOL PM) 25-500 MG TABS   Oral   Take 1 tablet by mouth at bedtime as needed (pain).         Marland Kitchen doxazosin (CARDURA) 8 MG tablet   Oral   Take 1 tablet by mouth At bedtime.         . fish oil-omega-3 fatty acids 1000 MG capsule   Oral   Take 2 g by mouth daily.          Marland Kitchen menthol-cetylpyridinium (CEPACOL) 3 MG lozenge   Oral   Take 1 lozenge by mouth as needed for sore throat.         . Multiple Vitamin (MULTIVITAMIN) tablet   Oral   Take 1 tablet by mouth daily.         . potassium chloride (K-DUR) 10 MEQ tablet   Oral   Take 20 mEq by mouth 2 (two) times daily.         . vitamin B-12 (CYANOCOBALAMIN) 1000 MCG tablet   Oral   Take 1,000 mcg by mouth  daily.          BP 156/91  Pulse 74  Temp(Src) 99.4 F (37.4 C) (Oral)  Resp 18  SpO2 96% Physical Exam  Nursing note and vitals reviewed. Constitutional: He is oriented to person, place, and time. He appears well-developed and well-nourished.  HENT:  Head: Normocephalic and atraumatic.  Right Ear: External ear normal.  Left Ear: External ear normal.  Nose: Nose normal.  Right-sided peritonsillar abscess, airway is patent, some tonsillar exudates are seen, muffled voice  Eyes: Conjunctivae and EOM are normal. Pupils are equal, round, and reactive to light. Right eye exhibits no discharge. Left eye exhibits no discharge. No scleral icterus.  Neck: Normal range of motion. Neck supple. No JVD present.  Cardiovascular: Normal rate, regular rhythm, normal heart sounds and intact distal pulses.  Exam reveals no gallop and no friction rub.   No murmur heard. Pulmonary/Chest: Effort normal and breath sounds normal. No respiratory distress. He has no wheezes. He has no rales. He exhibits no tenderness.  Abdominal: Soft. He exhibits no distension.  Musculoskeletal: Normal range of motion. He exhibits no edema and  no tenderness.  Neurological: He is alert and oriented to person, place, and time.  Skin: Skin is warm and dry.  Psychiatric: He has a normal mood and affect. His behavior is normal. Judgment and thought content normal.    ED Course  Procedures (including critical care time) Results for orders placed during the hospital encounter of 09/25/13  RAPID STREP SCREEN      Result Value Range   Streptococcus, Group A Screen (Direct) NEGATIVE  NEGATIVE  CBC WITH DIFFERENTIAL      Result Value Range   WBC 9.7  4.0 - 10.5 K/uL   RBC 4.83  4.22 - 5.81 MIL/uL   Hemoglobin 13.7  13.0 - 17.0 g/dL   HCT 69.4  85.4 - 62.7 %   MCV 85.9  78.0 - 100.0 fL   MCH 28.4  26.0 - 34.0 pg   MCHC 33.0  30.0 - 36.0 g/dL   RDW 03.5 (*) 00.9 - 38.1 %   Platelets 273  150 - 400 K/uL   Neutrophils Relative % 73  43 - 77 %   Neutro Abs 7.0  1.7 - 7.7 K/uL   Lymphocytes Relative 14  12 - 46 %   Lymphs Abs 1.3  0.7 - 4.0 K/uL   Monocytes Relative 12  3 - 12 %   Monocytes Absolute 1.2 (*) 0.1 - 1.0 K/uL   Eosinophils Relative 1  0 - 5 %   Eosinophils Absolute 0.1  0.0 - 0.7 K/uL   Basophils Relative 0  0 - 1 %   Basophils Absolute 0.0  0.0 - 0.1 K/uL  POCT I-STAT, CHEM 8      Result Value Range   Sodium 139  137 - 147 mEq/L   Potassium 4.4  3.7 - 5.3 mEq/L   Chloride 101  96 - 112 mEq/L   BUN 21  6 - 23 mg/dL   Creatinine, Ser 8.29 (*) 0.50 - 1.35 mg/dL   Glucose, Bld 99  70 - 99 mg/dL   Calcium, Ion 9.37  1.69 - 1.30 mmol/L   TCO2 28  0 - 100 mmol/L   Hemoglobin 15.0  13.0 - 17.0 g/dL   HCT 67.8  93.8 - 10.1 %   No results found.   EKG Interpretation   None       MDM   1. Peritonsillar abscess  Patient with peritonsillar abscess.  Will check labs, give fluids, check CT, start pain meds.  11:48 PM Discussed the patient with Dr. Ranae Palms, who recommends giving decadron and deferring CT with immediate ENT consult.  Anticipate discharge with ENT follow-up in the morning.   12:12  AM Patient discussed with ENT Dr. Pollyann Kennedy, who recommends steroids, antibiotics, and follow-up in his office in the morning. Airway is patent, appearance is unchanged.   Roxy Horseman, PA-C 09/26/13 [phone]

## 2013-09-25 NOTE — ED Notes (Signed)
Bed: WA02 Expected date:  Expected time:  Means of arrival:  Comments: Hold for [facility]

## 2013-09-25 NOTE — ED Notes (Addendum)
Pt presents with c/o an abscess on his tonsils. Pt was seen at the walk-in clinic earlier this week and also earlier tonight for same. Pt has some muffled talking. Pt was given a shot of pain medicine at the officer per family. Unable to swallow much food or liquid in the past few days.

## 2013-09-26 LAB — CBC WITH DIFFERENTIAL/PLATELET
Basophils Absolute: 0 10*3/uL (ref 0.0–0.1)
Basophils Relative: 0 % (ref 0–1)
EOS ABS: 0.1 10*3/uL (ref 0.0–0.7)
EOS PCT: 1 % (ref 0–5)
HEMATOCRIT: 41.5 % (ref 39.0–52.0)
HEMOGLOBIN: 13.7 g/dL (ref 13.0–17.0)
LYMPHS ABS: 1.3 10*3/uL (ref 0.7–4.0)
Lymphocytes Relative: 14 % (ref 12–46)
MCH: 28.4 pg (ref 26.0–34.0)
MCHC: 33 g/dL (ref 30.0–36.0)
MCV: 85.9 fL (ref 78.0–100.0)
MONOS PCT: 12 % (ref 3–12)
Monocytes Absolute: 1.2 10*3/uL — ABNORMAL HIGH (ref 0.1–1.0)
Neutro Abs: 7 10*3/uL (ref 1.7–7.7)
Neutrophils Relative %: 73 % (ref 43–77)
Platelets: 273 10*3/uL (ref 150–400)
RBC: 4.83 MIL/uL (ref 4.22–5.81)
RDW: 17.1 % — ABNORMAL HIGH (ref 11.5–15.5)
WBC: 9.7 10*3/uL (ref 4.0–10.5)

## 2013-09-26 LAB — RAPID STREP SCREEN (MED CTR MEBANE ONLY): Streptococcus, Group A Screen (Direct): NEGATIVE

## 2013-09-26 MED ORDER — HYDROCODONE-ACETAMINOPHEN 7.5-325 MG/15ML PO SOLN
10.0000 mL | Freq: Four times a day (QID) | ORAL | Status: DC | PRN
Start: 2013-09-26 — End: 2020-08-18

## 2013-09-26 MED ORDER — HYDROCODONE-ACETAMINOPHEN 7.5-325 MG/15ML PO SOLN
10.0000 mL | Freq: Once | ORAL | Status: AC
  Administered 2013-09-26: 10 mL via ORAL
  Filled 2013-09-26: qty 15

## 2013-09-26 NOTE — ED Notes (Signed)
Patient c/o sore throat. Saw PCP last week was told strep negative, today saw them again for increase in pain and was dx with peritonsillar abcess, and referred here.  Patient rates pain 4/10. Has very soft speech and per wife has been having decreased ability to speak for several days.

## 2013-09-26 NOTE — ED Provider Notes (Signed)
Medical screening examination/treatment/procedure(s) were performed by non-physician practitioner and as supervising physician I was immediately available for consultation/collaboration.  EKG Interpretation   None         Loren Racer, MD 09/26/13 [phone]

## 2013-09-26 NOTE — Discharge Instructions (Signed)
Peritonsillar Abscess A peritonsillar abscess is a collection of pus located in the back of the throat behind the tonsils. It usually occurs when a streptococcal infection of the throat or tonsils spreads into the space around the tonsils. They are almost always caused by the streptococcal germ (bacteria). The treatment of a peritonsillar abscess is most often drainage accomplished by putting a needle into the abscess or cutting (incising) and draining the abscess. This is most often followed with a course of antibiotics. HOME CARE INSTRUCTIONS  If your abscess was drained by your caregiver today, rinse your throat (gargle) with warm salt water four times per day or as needed for comfort. Do not swallow this mixture. Mix 1 teaspoon of salt in 8 ounces of warm water for gargling.  Rest in bed as needed. Resume activities as able.  Apply cold to your neck for pain relief. Fill a plastic bag with ice and wrap it in a towel. Hold the ice on your neck for 20 minutes 4 times per day.  Eat a soft or liquid diet as tolerated while your throat remains sore. Popsicles and ice cream may be good early choices. Drinking plenty of cold fluids will probably be soothing and help take swelling down in between the warm gargles.  Only take over-the-counter or prescription medicines for pain, discomfort, or fever as directed by your caregiver. Do not use aspirin unless directed by your physician. Aspirin slows down the clotting process. It can also cause bleeding from the drainage area if this was needled or incised today.  If antibiotics were prescribed, take them as directed for the full course of the prescription. Even if you feel you are well, you need to take them. SEEK MEDICAL CARE IF:   You have increased pain, swelling, redness, or drainage in your throat.  You develop signs of infection such as dizziness, headache, lethargy, or generalized feelings of illness.  You have difficulty breathing, swallowing or  eating.  You show signs of becoming dehydrated (lightheadedness when standing, decreased urine output, a fast heart rate, or dry mouth and mucous membranes). SEEK IMMEDIATE MEDICAL CARE IF:   You have a fever.  You are coughing up or vomiting blood.  You develop more severe throat pain uncontrolled with medicines or you start to drool.  You develop difficulty breathing, talking, or find it easier to breathe while leaning forward. Document Released: 08/21/2005 Document Revised: 11/13/2011 Document Reviewed: 04/03/2008 [employer] Patient Information 2014 [employer].  

## 2013-09-27 LAB — CULTURE, GROUP A STREP

## 2014-06-24 ENCOUNTER — Other Ambulatory Visit: Payer: Self-pay | Admitting: Gastroenterology

## 2014-06-24 DIAGNOSIS — Z86010 Personal history of colonic polyps: Secondary | ICD-10-CM

## 2014-07-29 ENCOUNTER — Other Ambulatory Visit: Payer: BC Managed Care – PPO

## 2014-08-24 ENCOUNTER — Other Ambulatory Visit: Payer: BC Managed Care – PPO

## 2014-09-21 ENCOUNTER — Ambulatory Visit
Admission: RE | Admit: 2014-09-21 | Discharge: 2014-09-21 | Disposition: A | Discharge status: Home / Self Care | Payer: BC Managed Care – PPO | Source: Ambulatory Visit | Attending: Gastroenterology | Admitting: Gastroenterology

## 2014-09-21 DIAGNOSIS — Z86010 Personal history of colonic polyps: Secondary | ICD-10-CM

## 2015-10-19 ENCOUNTER — Other Ambulatory Visit: Payer: Self-pay | Admitting: Surgery

## 2015-12-21 ENCOUNTER — Ambulatory Visit: Payer: BC Managed Care – PPO | Admitting: Dietician

## 2018-10-09 ENCOUNTER — Other Ambulatory Visit: Payer: Self-pay | Admitting: Family Medicine

## 2018-10-09 DIAGNOSIS — M8580 Other specified disorders of bone density and structure, unspecified site: Secondary | ICD-10-CM

## 2018-12-04 ENCOUNTER — Inpatient Hospital Stay: Admission: RE | Admit: 2018-12-04 | Payer: BC Managed Care – PPO | Source: Ambulatory Visit

## 2019-01-14 ENCOUNTER — Other Ambulatory Visit: Payer: BC Managed Care – PPO

## 2019-01-29 ENCOUNTER — Other Ambulatory Visit: Payer: BC Managed Care – PPO

## 2019-06-16 ENCOUNTER — Other Ambulatory Visit: Payer: Self-pay

## 2019-06-16 ENCOUNTER — Ambulatory Visit
Admission: RE | Admit: 2019-06-16 | Discharge: 2019-06-16 | Disposition: A | Discharge status: Home / Self Care | Payer: Medicare Other | Source: Ambulatory Visit | Attending: Family Medicine | Admitting: Family Medicine

## 2019-06-16 DIAGNOSIS — M8580 Other specified disorders of bone density and structure, unspecified site: Secondary | ICD-10-CM

## 2020-07-23 NOTE — Patient Instructions (Signed)
DUE TO COVID-19 ONLY ONE VISITOR IS ALLOWED TO COME WITH YOU AND STAY IN THE WAITING ROOM ONLY DURING PRE OP AND PROCEDURE DAY OF SURGERY. THE 1 VISITOR  MAY VISIT WITH YOU AFTER SURGERY IN YOUR PRIVATE ROOM DURING VISITING HOURS ONLY!  YOU NEED TO HAVE A COVID 19 TEST ON: 08/03/20  @           , THIS TEST MUST BE DONE BEFORE SURGERY,  COVID TESTING SITE [address] [place] [address], IT IS ON THE RIGHT GOING OUT [address] APPROXIMATELY  2 MINUTES PAST [employer] ON THE RIGHT. ONCE YOUR COVID TEST IS COMPLETED,  PLEASE BEGIN THE QUARANTINE INSTRUCTIONS AS OUTLINED IN YOUR HANDOUT.                Jacob Mayo    Your procedure is scheduled on: 1/   Report to [facility] Main  Entrance   Report to admitting at AM     Call this number if you have problems the morning of surgery [phone]    Remember: Do not eat food or drink liquids :After Midnight. BRUSH YOUR TEETH MORNING OF SURGERY AND RINSE YOUR MOUTH OUT, NO CHEWING GUM CANDY OR MINTS.     Take these medicines the morning of surgery with A SIP OF WATER:   DO NOT TAKE ANY DIABETIC MEDICATIONS DAY OF YOUR SURGERY                               You may not have any metal on your body including hair pins and              piercings  Do not wear jewelry, make-up, lotions, powders or perfumes, deodorant             Do not wear nail polish on your fingernails.  Do not shave  48 hours prior to surgery.              Men may shave face and neck.   Do not bring valuables to the hospital. [place] IS NOT             RESPONSIBLE   FOR VALUABLES.  Contacts, dentures or bridgework may not be worn into surgery.  Leave suitcase in the car. After surgery it may be brought to your room.     Patients discharged the day of surgery will not be allowed to drive home. IF YOU ARE HAVING SURGERY AND GOING HOME THE SAME DAY, YOU MUST HAVE AN ADULT TO DRIVE YOU HOME AND BE WITH YOU FOR 24 HOURS. YOU MAY GO HOME BY  TAXI OR UBER OR ORTHERWISE, BUT AN ADULT MUST ACCOMPANY YOU HOME AND STAY WITH YOU FOR 24 HOURS.  Name and phone number of your driver:  Special Instructions: N/A              Please read over the following fact sheets you were given: _____________________________________________________________________             [facility] - Preparing for Surgery Before surgery, you can play an important role.  Because skin is not sterile, your skin needs to be as free of germs as possible.  You can reduce the number of germs on your skin by washing with CHG (chlorahexidine gluconate) soap before surgery.  CHG is an antiseptic cleaner which kills germs and bonds with the skin to continue killing  germs even after washing. Please DO NOT use if you have an allergy to CHG or antibacterial soaps.  If your skin becomes reddened/irritated stop using the CHG and inform your nurse when you arrive at Short Stay. Do not shave (including legs and underarms) for at least 48 hours prior to the first CHG shower.  You may shave your face/neck. Please follow these instructions carefully:  1.  Shower with CHG Soap the night before surgery and the  morning of Surgery.  2.  If you choose to wash your hair, wash your hair first as usual with your  normal  shampoo.  3.  After you shampoo, rinse your hair and body thoroughly to remove the  shampoo.                           4.  Use CHG as you would any other liquid soap.  You can apply chg directly  to the skin and wash                       Gently with a scrungie or clean washcloth.  5.  Apply the CHG Soap to your body ONLY FROM THE NECK DOWN.   Do not use on face/ open                           Wound or open sores. Avoid contact with eyes, ears mouth and genitals (private parts).                       Wash face,  Genitals (private parts) with your normal soap.             6.  Wash thoroughly, paying special attention to the area where your surgery  will be performed.  7.   Thoroughly rinse your body with warm water from the neck down.  8.  DO NOT shower/wash with your normal soap after using and rinsing off  the CHG Soap.                9.  Pat yourself dry with a clean towel.            10.  Wear clean pajamas.            11.  Place clean sheets on your bed the night of your first shower and do not  sleep with pets. Day of Surgery : Do not apply any lotions/deodorants the morning of surgery.  Please wear clean clothes to the hospital/surgery center.  FAILURE TO FOLLOW THESE INSTRUCTIONS MAY RESULT IN THE CANCELLATION OF YOUR SURGERY PATIENT SIGNATURE_________________________________  NURSE SIGNATURE__________________________________  ________________________________________________________________________   Jacob Mayo  An incentive spirometer is a tool that can help keep your lungs clear and active. This tool measures how well you are filling your lungs with each breath. Taking long deep breaths may help reverse or decrease the chance of developing breathing (pulmonary) problems (especially infection) following:  A long period of time when you are unable to move or be active. BEFORE THE PROCEDURE   If the spirometer includes an indicator to show your best effort, your nurse or respiratory therapist will set it to a desired goal.  If possible, sit up straight or lean slightly forward. Try not to slouch.  Hold the incentive spirometer in an upright position. INSTRUCTIONS FOR USE  1. Sit on the  edge of your bed if possible, or sit up as far as you can in bed or on a chair. 2. Hold the incentive spirometer in an upright position. 3. Breathe out normally. 4. Place the mouthpiece in your mouth and seal your lips tightly around it. 5. Breathe in slowly and as deeply as possible, raising the piston or the ball toward the top of the column. 6. Hold your breath for 3-5 seconds or for as long as possible. Allow the piston or ball to fall to the bottom  of the column. 7. Remove the mouthpiece from your mouth and breathe out normally. 8. Rest for a few seconds and repeat Steps 1 through 7 at least 10 times every 1-2 hours when you are awake. Take your time and take a few normal breaths between deep breaths. 9. The spirometer may include an indicator to show your best effort. Use the indicator as a goal to work toward during each repetition. 10. After each set of 10 deep breaths, practice coughing to be sure your lungs are clear. If you have an incision (the cut made at the time of surgery), support your incision when coughing by placing a pillow or rolled up towels firmly against it. Once you are able to get out of bed, walk around indoors and cough well. You may stop using the incentive spirometer when instructed by your caregiver.  RISKS AND COMPLICATIONS  Take your time so you do not get dizzy or light-headed.  If you are in pain, you may need to take or ask for pain medication before doing incentive spirometry. It is harder to take a deep breath if you are having pain. AFTER USE  Rest and breathe slowly and easily.  It can be helpful to keep track of a log of your progress. Your caregiver can provide you with a simple table to help with this. If you are using the spirometer at home, follow these instructions: SEEK MEDICAL CARE IF:   You are having difficultly using the spirometer.  You have trouble using the spirometer as often as instructed.  Your pain medication is not giving enough relief while using the spirometer.  You develop fever of 100.5 F (38.1 C) or higher. SEEK IMMEDIATE MEDICAL CARE IF:   You cough up bloody sputum that had not been present before.  You develop fever of 102 F (38.9 C) or greater.  You develop worsening pain at or near the incision site. MAKE SURE YOU:   Understand these instructions.  Will watch your condition.  Will get help right away if you are not doing well or get worse. Document  Released: 01/01/2007 Document Revised: 11/13/2011 Document Reviewed: 03/04/2007 [facility] Patient Information 2014 [place], [place].   ________________________________________________________________________

## 2020-07-26 ENCOUNTER — Encounter (HOSPITAL_COMMUNITY)
Admission: RE | Admit: 2020-07-26 | Discharge: 2020-07-26 | Disposition: A | Discharge status: Home / Self Care | Payer: BC Managed Care – PPO | Source: Ambulatory Visit | Attending: Orthopedic Surgery | Admitting: Orthopedic Surgery

## 2020-08-25 ENCOUNTER — Other Ambulatory Visit: Payer: Self-pay

## 2020-08-25 ENCOUNTER — Encounter (HOSPITAL_COMMUNITY)
Admission: RE | Admit: 2020-08-25 | Discharge: 2020-08-25 | Disposition: A | Discharge status: Home / Self Care | Payer: Medicare PPO | Source: Ambulatory Visit | Attending: Orthopedic Surgery | Admitting: Orthopedic Surgery

## 2020-08-25 ENCOUNTER — Encounter (HOSPITAL_COMMUNITY): Payer: Self-pay

## 2020-08-25 ENCOUNTER — Other Ambulatory Visit (HOSPITAL_COMMUNITY): Payer: BC Managed Care – PPO

## 2020-08-25 DIAGNOSIS — Z01818 Encounter for other preprocedural examination: Secondary | ICD-10-CM | POA: Insufficient documentation

## 2020-08-25 HISTORY — DX: Unspecified osteoarthritis, unspecified site: M19.90

## 2020-08-25 HISTORY — DX: Essential (primary) hypertension: I10

## 2020-08-25 LAB — COMPREHENSIVE METABOLIC PANEL
ALT: 33 U/L (ref 0–44)
AST: 41 U/L (ref 15–41)
Albumin: 4.3 g/dL (ref 3.5–5.0)
Alkaline Phosphatase: 96 U/L (ref 38–126)
Anion gap: 9 (ref 5–15)
BUN: 29 mg/dL — ABNORMAL HIGH (ref 8–23)
CO2: 24 mmol/L (ref 22–32)
Calcium: 9.1 mg/dL (ref 8.9–10.3)
Chloride: 106 mmol/L (ref 98–111)
Creatinine, Ser: 1.26 mg/dL — ABNORMAL HIGH (ref 0.61–1.24)
GFR, Estimated: >60 mL/min (ref >60)
Glucose, Bld: 87 mg/dL (ref 70–99)
Potassium: 4.5 mmol/L (ref 3.5–5.1)
Sodium: 139 mmol/L (ref 135–145)
Total Bilirubin: 0.7 mg/dL (ref 0.3–1.2)
Total Protein: 7.2 g/dL (ref 6.5–8.1)

## 2020-08-25 LAB — TYPE AND SCREEN
ABO/RH(D): B POS
Antibody Screen: NEGATIVE

## 2020-08-25 LAB — CBC
HCT: 42.2 % (ref 39.0–52.0)
Hemoglobin: 13.8 g/dL (ref 13.0–17.0)
MCH: 32 pg (ref 26.0–34.0)
MCHC: 32.7 g/dL (ref 30.0–36.0)
MCV: 97.9 fL (ref 80.0–100.0)
Platelets: 202 10*3/uL (ref 150–400)
RBC: 4.31 MIL/uL (ref 4.22–5.81)
RDW: 14.4 % (ref 11.5–15.5)
WBC: 6 10*3/uL (ref 4.0–10.5)
nRBC: 0 % (ref 0.0–0.2)

## 2020-08-25 LAB — APTT: aPTT: 29 seconds (ref 24–36)

## 2020-08-25 LAB — PROTIME-INR
INR: 1.1 (ref 0.8–1.2)
Prothrombin Time: 13.9 seconds (ref 11.4–15.2)

## 2020-08-25 LAB — SURGICAL PCR SCREEN
MRSA, PCR: NEGATIVE
Staphylococcus aureus: NEGATIVE

## 2020-08-25 NOTE — Progress Notes (Signed)
COVID Vaccine Completed: Yes Date COVID Vaccine completed: 04/12/20 [place] COVID vaccine manufacturer: Pfizer     PCP - Dr. Catha Gosselin Cardiologist -   Chest x-ray -  EKG -  Stress Test -  ECHO -  Cardiac Cath -  Pacemaker/ICD device last checked:  Sleep Study - Yes CPAP - No  Fasting Blood Sugar -  Checks Blood Sugar _____ times a day  Blood Thinner Instructions: Aspirin Instructions: Last Dose:  Anesthesia review: Hx: HTN,OSA(No CPAP)  Patient denies shortness of breath, fever, cough and chest pain at PAT appointment   Patient verbalized understanding of instructions that were given to them at the PAT appointment. Patient was also instructed that they will need to review over the PAT instructions again at home before surgery.

## 2020-08-25 NOTE — Patient Instructions (Signed)
DUE TO COVID-19 ONLY ONE VISITOR IS ALLOWED TO COME WITH YOU AND STAY IN THE WAITING ROOM ONLY DURING PRE OP AND PROCEDURE DAY OF SURGERY. THE 1 VISITOR  MAY VISIT WITH YOU AFTER SURGERY IN YOUR PRIVATE ROOM DURING VISITING HOURS ONLY!  YOU NEED TO HAVE A COVID 19 TEST ON: 09/02/20 @              , THIS TEST MUST BE DONE BEFORE SURGERY,  COVID TESTING SITE [address] [place] [address], IT IS ON THE RIGHT GOING OUT [address] APPROXIMATELY  2 MINUTES PAST [employer] ON THE RIGHT. ONCE YOUR COVID TEST IS COMPLETED,  PLEASE BEGIN THE QUARANTINE INSTRUCTIONS AS OUTLINED IN YOUR HANDOUT.                Jacob Mayo    Your procedure is scheduled on: 09/06/20   Report to [facility] Main  Entrance   Report to admitting at: 8:00 AM     Call this number if you have problems the morning of surgery [phone]    Remember:   NO SOLID FOOD AFTER MIDNIGHT THE NIGHT PRIOR TO SURGERY. NOTHING BY MOUTH EXCEPT CLEAR LIQUIDS UNTIL: 7:30 AM . PLEASE FINISH ENSURE DRINK PER SURGEON ORDER  WHICH NEEDS TO BE COMPLETED AT : 7:30 AM.  CLEAR LIQUID DIET   Foods Allowed                                                                     Foods Excluded  Coffee and tea, regular and decaf                             liquids that you cannot  Plain Jell-O any favor except red or purple                                           see through such as: Fruit ices (not with fruit pulp)                                     milk, soups, orange juice  Iced Popsicles                                    All solid food Carbonated beverages, regular and diet                                    Cranberry, grape and apple juices Sports drinks like Gatorade Lightly seasoned clear broth or consume(fat free) Sugar, honey syrup  Sample Menu Breakfast                                Lunch  Supper Cranberry juice                    Beef broth                             Chicken broth Jell-O                                     Grape juice                           Apple juice Coffee or tea                        Jell-O                                      Popsicle                                                Coffee or tea                        Coffee or tea  _____________________________________________________________________   BRUSH YOUR TEETH MORNING OF SURGERY AND RINSE YOUR MOUTH OUT, NO CHEWING GUM CANDY OR MINTS.    Take these medicines the morning of surgery with A SIP OF WATER: allopurinol,doxazosin.                                You may not have any metal on your body including hair pins and              piercings  Do not wear jewelry, lotions, powders or perfumes, deodorant             Men may shave face and neck.   Do not bring valuables to the hospital. [facility] IS NOT             RESPONSIBLE   FOR VALUABLES.  Contacts, dentures or bridgework may not be worn into surgery.  Leave suitcase in the car. After surgery it may be brought to your room.     Patients discharged the day of surgery will not be allowed to drive home. IF YOU ARE HAVING SURGERY AND GOING HOME THE SAME DAY, YOU MUST HAVE AN ADULT TO DRIVE YOU HOME AND BE WITH YOU FOR 24 HOURS. YOU MAY GO HOME BY TAXI OR UBER OR ORTHERWISE, BUT AN ADULT MUST ACCOMPANY YOU HOME AND STAY WITH YOU FOR 24 HOURS.  Name and phone number of your driver:  Special Instructions: N/A              Please read over the following fact sheets you were given: _____________________________________________________________________          [facility] - Preparing for Surgery Before surgery, you can play an important role.  Because skin is not sterile, your skin needs to be as free of germs as possible.  You can reduce the number of germs on your skin by washing with CHG (  chlorahexidine gluconate) soap before surgery.  CHG is an antiseptic cleaner which kills germs and bonds with the skin to  continue killing germs even after washing. Please DO NOT use if you have an allergy to CHG or antibacterial soaps.  If your skin becomes reddened/irritated stop using the CHG and inform your nurse when you arrive at Short Stay. Do not shave (including legs and underarms) for at least 48 hours prior to the first CHG shower.  You may shave your face/neck. Please follow these instructions carefully:  1.  Shower with CHG Soap the night before surgery and the  morning of Surgery.  2.  If you choose to wash your hair, wash your hair first as usual with your  normal  shampoo.  3.  After you shampoo, rinse your hair and body thoroughly to remove the  shampoo.                           4.  Use CHG as you would any other liquid soap.  You can apply chg directly  to the skin and wash                       Gently with a scrungie or clean washcloth.  5.  Apply the CHG Soap to your body ONLY FROM THE NECK DOWN.   Do not use on face/ open                           Wound or open sores. Avoid contact with eyes, ears mouth and genitals (private parts).                       Wash face,  Genitals (private parts) with your normal soap.             6.  Wash thoroughly, paying special attention to the area where your surgery  will be performed.  7.  Thoroughly rinse your body with warm water from the neck down.  8.  DO NOT shower/wash with your normal soap after using and rinsing off  the CHG Soap.                9.  Pat yourself dry with a clean towel.            10.  Wear clean pajamas.            11.  Place clean sheets on your bed the night of your first shower and do not  sleep with pets. Day of Surgery : Do not apply any lotions/deodorants the morning of surgery.  Please wear clean clothes to the hospital/surgery center.  FAILURE TO FOLLOW THESE INSTRUCTIONS MAY RESULT IN THE CANCELLATION OF YOUR SURGERY PATIENT SIGNATURE_________________________________  NURSE  SIGNATURE__________________________________  ________________________________________________________________________   Jacob Mayo  An incentive spirometer is a tool that can help keep your lungs clear and active. This tool measures how well you are filling your lungs with each breath. Taking long deep breaths may help reverse or decrease the chance of developing breathing (pulmonary) problems (especially infection) following:  A long period of time when you are unable to move or be active. BEFORE THE PROCEDURE   If the spirometer includes an indicator to show your best effort, your nurse or respiratory therapist will set it to a desired goal.  If possible, sit up straight or lean  slightly forward. Try not to slouch.  Hold the incentive spirometer in an upright position. INSTRUCTIONS FOR USE  1. Sit on the edge of your bed if possible, or sit up as far as you can in bed or on a chair. 2. Hold the incentive spirometer in an upright position. 3. Breathe out normally. 4. Place the mouthpiece in your mouth and seal your lips tightly around it. 5. Breathe in slowly and as deeply as possible, raising the piston or the ball toward the top of the column. 6. Hold your breath for 3-5 seconds or for as long as possible. Allow the piston or ball to fall to the bottom of the column. 7. Remove the mouthpiece from your mouth and breathe out normally. 8. Rest for a few seconds and repeat Steps 1 through 7 at least 10 times every 1-2 hours when you are awake. Take your time and take a few normal breaths between deep breaths. 9. The spirometer may include an indicator to show your best effort. Use the indicator as a goal to work toward during each repetition. 10. After each set of 10 deep breaths, practice coughing to be sure your lungs are clear. If you have an incision (the cut made at the time of surgery), support your incision when coughing by placing a pillow or rolled up towels firmly  against it. Once you are able to get out of bed, walk around indoors and cough well. You may stop using the incentive spirometer when instructed by your caregiver.  RISKS AND COMPLICATIONS  Take your time so you do not get dizzy or light-headed.  If you are in pain, you may need to take or ask for pain medication before doing incentive spirometry. It is harder to take a deep breath if you are having pain. AFTER USE  Rest and breathe slowly and easily.  It can be helpful to keep track of a log of your progress. Your caregiver can provide you with a simple table to help with this. If you are using the spirometer at home, follow these instructions: [place] IF:   You are having difficultly using the spirometer.  You have trouble using the spirometer as often as instructed.  Your pain medication is not giving enough relief while using the spirometer.  You develop fever of 100.5 F (38.1 C) or higher. SEEK IMMEDIATE MEDICAL CARE IF:   You cough up bloody sputum that had not been present before.  You develop fever of 102 F (38.9 C) or greater.  You develop worsening pain at or near the incision site. MAKE SURE YOU:   Understand these instructions.  Will watch your condition.  Will get help right away if you are not doing well or get worse. Document Released: 01/01/2007 Document Revised: 11/13/2011 Document Reviewed: 03/04/2007 [facility] Patient Information 2014 [place], [place].   ________________________________________________________________________

## 2020-09-02 ENCOUNTER — Other Ambulatory Visit (HOSPITAL_COMMUNITY)
Admission: RE | Admit: 2020-09-02 | Discharge: 2020-09-02 | Disposition: A | Discharge status: Home / Self Care | Payer: Medicare PPO | Source: Ambulatory Visit | Attending: Orthopedic Surgery | Admitting: Orthopedic Surgery

## 2020-09-02 DIAGNOSIS — Z20822 Contact with and (suspected) exposure to covid-19: Secondary | ICD-10-CM | POA: Diagnosis not present

## 2020-09-02 DIAGNOSIS — Z01812 Encounter for preprocedural laboratory examination: Secondary | ICD-10-CM | POA: Diagnosis present

## 2020-09-02 LAB — SARS CORONAVIRUS 2 (TAT 6-24 HRS): SARS Coronavirus 2: NEGATIVE

## 2020-09-05 MED ORDER — BUPIVACAINE LIPOSOME 1.3 % IJ SUSP
20.0000 mL | INTRAMUSCULAR | Status: DC
  Filled 2020-09-05: qty 20

## 2020-09-05 NOTE — H&P (Signed)
TOTAL KNEE ADMISSION H&P  Patient is being admitted for left total knee arthroplasty.  Subjective:  Chief Complaint:left knee pain.  HPI: Jacob Mayo, 70 y.o. male, has a history of pain and functional disability in the left knee due to arthritis and has failed non-surgical conservative treatments for greater than 12 weeks to includecorticosteriod injections and activity modification.  Onset of symptoms was gradual, starting 2 years ago with gradually worsening course since that time. The patient noted prior procedures on the knee to include  arthroscopy and menisectomy on the left knee(s).  Patient currently rates pain in the left knee(s) at 7 out of 10 with activity. Patient has worsening of pain with activity and weight bearing and pain that interferes with activities of daily living.  Patient has evidence of joint space narrowing by imaging studies.There is no active infection.  Patient Active Problem List   Diagnosis Date Noted  . PAC (premature atrial contraction) 08/05/2012  . Frontal headache 02/23/2012  . Bronchitis, chronic obstructive, with exacerbation (HCC) 02/23/2012   Past Medical History:  Diagnosis Date  . Arthritis   . Hypertension   . Sleep apnea    states this is no longer a problem since weight loss  . Wegener's granulomatosis (granulomatosis with polyangiitis) 1997   Open lung biopsy. Prednisone/Cytoxan    Past Surgical History:  Procedure Laterality Date  . APPENDECTOMY    . EYE SURGERY    . GASTRIC BYPASS  2006  . HERNIA REPAIR    . KNEE SURGERY    . LUNG BIOPSY  1997    Current Facility-Administered Medications  Medication Dose Route Frequency Provider Last Rate Last Admin  . [START ON 09/06/2020] bupivacaine liposome (EXPAREL) 1.3 % injection 266 mg  20 mL Other On Call to OR Dan[facility] Bad, [facility]       Current Outpatient Medications  Medication Sig Dispense Refill Last Dose  . alendronate (FOSAMAX) 70 MG tablet Take 70 mg by mouth every Wednesday.  Take with a full glass of water on an empty stomach.     Marland Kitchen allopurinol (ZYLOPRIM) 100 MG tablet Take 200 mg by mouth daily.     . diphenhydramine-acetaminophen (TYLENOL PM) 25-500 MG TABS Take 1 tablet by mouth at bedtime as needed (pain).     Marland Kitchen doxazosin (CARDURA) 8 MG tablet Take 8 mg by mouth At bedtime.     . fish oil-omega-3 fatty acids 1000 MG capsule Take 2 g by mouth daily.      . methotrexate (50 MG/ML) 1 g injection Inject into the vein every Saturday. 0.6 mL     . Multiple Vitamin (MULTIVITAMIN) tablet Take 2 tablets by mouth daily.     . potassium chloride (K-DUR) 10 MEQ tablet Take 20 mEq by mouth 2 (two) times daily.     . rosuvastatin (CRESTOR) 5 MG tablet Take 5 mg by mouth daily.     Marland Kitchen sulfamethoxazole-trimethoprim (BACTRIM) 400-80 MG tablet Take 1 tablet by mouth 2 (two) times daily.     . vitamin B-12 (CYANOCOBALAMIN) 1000 MCG tablet Take 1,000 mcg by mouth daily.     Marland Kitchen losartan (COZAAR) 25 MG tablet Take 25 mg by mouth daily.      No Known Allergies  Social History   Tobacco Use  . Smoking status: Never Smoker  . Smokeless tobacco: Never Used  Substance Use Topics  . Alcohol use: Yes    Alcohol/week: 2.0 standard drinks    Types: 2 Glasses of wine per week  Comment: occas.    Family History  Problem Relation Age of Onset  . Cancer Father      Review of Systems  Constitutional: Negative for chills and fever.  Respiratory: Negative for cough and shortness of breath.   Cardiovascular: Negative for chest pain.  Gastrointestinal: Negative for nausea and vomiting.  Musculoskeletal: Positive for arthralgias.    Objective:  Physical Exam Patient is a 70 year old male.  Well nourished and well developed. General: Alert and oriented x3, cooperative and pleasant, no acute distress. Head: normocephalic, atraumatic, neck supple. Eyes: EOMI. Respiratory: breath sounds clear in all fields, no wheezing, rales, or rhonchi. Cardiovascular: Regular rate and rhythm,  no murmurs, gallops or rubs.  Musculoskeletal: Left Knee Exam: Trace effusion present. Slight valgus deformity. The range of motion is: 0 to 125 degrees. Moderate crepitus on range of motion of the knee. Positive medial joint line tenderness. Positive lateral joint line tenderness. Some instability with varus/valgus stress. No AP laxity.  Calves soft and nontender. Motor function intact in LE. Strength 5/5 LE bilaterally. Neuro: Distal pulses 2+. Sensation to light touch intact in LE.  Vital signs in last 24 hours:    Labs:   Estimated body mass index is 31.32 kg/m as calculated from the following:   Height as of 08/25/20: 5\' 7"  (1.702 m).   Weight as of 08/25/20: 90.7 kg.   Imaging Review Plain radiographs demonstrate severe degenerative joint disease of the left knee(s). The overall alignment isneutral. The bone quality appears to be adequate for age and reported activity level.    Assessment/Plan:  End stage arthritis, left knee   The patient history, physical examination, clinical judgment of the provider and imaging studies are consistent with end stage degenerative joint disease of the left knee(s) and total knee arthroplasty is deemed medically necessary. The treatment options including medical management, injection therapy arthroscopy and arthroplasty were discussed at length. The risks and benefits of total knee arthroplasty were presented and reviewed. The risks due to aseptic loosening, infection, stiffness, patella tracking problems, thromboembolic complications and other imponderables were discussed. The patient acknowledged the explanation, agreed to proceed with the plan and consent was signed. Patient is being admitted for inpatient treatment for surgery, pain control, PT, OT, prophylactic antibiotics, VTE prophylaxis, progressive ambulation and ADL's and discharge planning. The patient is planning to be discharged home.  Therapy Plans: outpatient therapy at  [facility] Disposition: Home with wife Planned DVT Prophylaxis: aspirin 325mg  BID DME needed: walker PCP: Dr. Hulan Fess, appointment 08/25/20 Rheumatologist: Dr. Amil Amen TXA: IV Allergies: NKDA Anesthesia Concerns: none BMI: 31.9 Not diabetic.  Other: Takes Bactrim daily for chronic eye infection. Methotrexate injection weekly on Saturday for RA. Does well with Oxycodone.  Patient's anticipated LOS is less than 2 midnights, meeting these requirements: - Younger than 44 - Lives within 1 hour of care - Has a competent adult at home to recover with post-op recover - NO history of  - Chronic pain requiring opiods  - Diabetes  - Coronary Artery Disease  - Heart failure  - Heart attack  - Stroke  - DVT/VTE  - Cardiac arrhythmia  - Respiratory Failure/COPD  - Renal failure  - Anemia  - Advanced Liver disease  - Patient was instructed on what medications to stop prior to surgery. - Follow-up visit in 2 weeks with Dr. Wynelle Link - Begin physical therapy following surgery - Pre-operative lab work as pre-surgical testing - Prescriptions will be provided in hospital at time of discharge  [facility],  PA-C Orthopedic Surgery [facility] [place] [phone]

## 2020-09-06 ENCOUNTER — Encounter (HOSPITAL_COMMUNITY)
Admission: RE | Disposition: A | Discharge status: Home / Self Care | Payer: Self-pay | Source: Home / Self Care | Attending: Orthopedic Surgery

## 2020-09-06 ENCOUNTER — Ambulatory Visit (HOSPITAL_COMMUNITY): Payer: Medicare PPO | Admitting: Anesthesiology

## 2020-09-06 ENCOUNTER — Other Ambulatory Visit: Payer: Self-pay

## 2020-09-06 ENCOUNTER — Encounter (HOSPITAL_COMMUNITY): Payer: Self-pay | Admitting: Orthopedic Surgery

## 2020-09-06 ENCOUNTER — Observation Stay (HOSPITAL_COMMUNITY)
Admission: RE | Admit: 2020-09-06 | Discharge: 2020-09-08 | Disposition: A | Discharge status: Home / Self Care | Payer: Medicare PPO | Attending: Orthopedic Surgery | Admitting: Orthopedic Surgery

## 2020-09-06 DIAGNOSIS — G8918 Other acute postprocedural pain: Secondary | ICD-10-CM | POA: Diagnosis not present

## 2020-09-06 DIAGNOSIS — Z79899 Other long term (current) drug therapy: Secondary | ICD-10-CM | POA: Insufficient documentation

## 2020-09-06 DIAGNOSIS — M179 Osteoarthritis of knee, unspecified: Secondary | ICD-10-CM

## 2020-09-06 DIAGNOSIS — M25562 Pain in left knee: Secondary | ICD-10-CM | POA: Diagnosis present

## 2020-09-06 DIAGNOSIS — G4730 Sleep apnea, unspecified: Secondary | ICD-10-CM | POA: Diagnosis not present

## 2020-09-06 DIAGNOSIS — M1712 Unilateral primary osteoarthritis, left knee: Principal | ICD-10-CM | POA: Insufficient documentation

## 2020-09-06 DIAGNOSIS — I10 Essential (primary) hypertension: Secondary | ICD-10-CM | POA: Diagnosis not present

## 2020-09-06 DIAGNOSIS — M1710 Unilateral primary osteoarthritis, unspecified knee: Secondary | ICD-10-CM | POA: Diagnosis present

## 2020-09-06 HISTORY — PX: TOTAL KNEE ARTHROPLASTY: SHX125

## 2020-09-06 SURGERY — ARTHROPLASTY, KNEE, TOTAL
Anesthesia: Regional | Site: Knee | Laterality: Left

## 2020-09-06 MED ORDER — PHENYLEPHRINE HCL (PRESSORS) 10 MG/ML IV SOLN
INTRAVENOUS | Status: AC
  Filled 2020-09-06: qty 1

## 2020-09-06 MED ORDER — CHLORHEXIDINE GLUCONATE 0.12 % MT SOLN: 15.0000 mL | Freq: Once | OROMUCOSAL | Status: AC

## 2020-09-06 MED ORDER — ACETAMINOPHEN 10 MG/ML IV SOLN
1000.0000 mg | Freq: Four times a day (QID) | INTRAVENOUS | Status: DC
  Administered 2020-09-06: 1000 mg via INTRAVENOUS
  Filled 2020-09-06: qty 100

## 2020-09-06 MED ORDER — ROSUVASTATIN CALCIUM 5 MG PO TABS
5.0000 mg | ORAL_TABLET | Freq: Every day | ORAL | Status: DC
  Administered 2020-09-07 – 2020-09-08 (×2): 5 mg via ORAL
  Filled 2020-09-06 (×2): qty 1

## 2020-09-06 MED ORDER — GABAPENTIN 300 MG PO CAPS
300.0000 mg | ORAL_CAPSULE | Freq: Three times a day (TID) | ORAL | Status: DC
  Administered 2020-09-06 – 2020-09-08 (×6): 300 mg via ORAL
  Filled 2020-09-06 (×6): qty 1

## 2020-09-06 MED ORDER — METHOCARBAMOL 1000 MG/10ML IJ SOLN
500.0000 mg | Freq: Four times a day (QID) | INTRAVENOUS | Status: DC | PRN
  Filled 2020-09-06: qty 5

## 2020-09-06 MED ORDER — DEXAMETHASONE SODIUM PHOSPHATE 10 MG/ML IJ SOLN
10.0000 mg | Freq: Once | INTRAMUSCULAR | Status: AC
  Administered 2020-09-07: 10 mg via INTRAVENOUS
  Filled 2020-09-06: qty 1

## 2020-09-06 MED ORDER — SODIUM CHLORIDE 0.9 % IV SOLN: INTRAVENOUS | Status: DC

## 2020-09-06 MED ORDER — STERILE WATER FOR IRRIGATION IR SOLN
Status: DC | PRN
  Administered 2020-09-06: 2000 mL

## 2020-09-06 MED ORDER — POTASSIUM CHLORIDE ER 10 MEQ PO TBCR
20.0000 meq | EXTENDED_RELEASE_TABLET | Freq: Two times a day (BID) | ORAL | Status: DC
  Administered 2020-09-06 – 2020-09-08 (×4): 20 meq via ORAL
  Filled 2020-09-06 (×8): qty 2

## 2020-09-06 MED ORDER — LACTATED RINGERS IV SOLN: INTRAVENOUS | Status: DC

## 2020-09-06 MED ORDER — DEXAMETHASONE SODIUM PHOSPHATE 10 MG/ML IJ SOLN
INTRAMUSCULAR | Status: DC | PRN
  Administered 2020-09-06: 10 mg via INTRAVENOUS

## 2020-09-06 MED ORDER — TRAMADOL HCL 50 MG PO TABS: 50.0000 mg | ORAL_TABLET | Freq: Four times a day (QID) | ORAL | Status: DC | PRN

## 2020-09-06 MED ORDER — ONDANSETRON HCL 4 MG/2ML IJ SOLN: 4.0000 mg | Freq: Four times a day (QID) | INTRAMUSCULAR | Status: DC | PRN

## 2020-09-06 MED ORDER — OXYCODONE HCL 5 MG PO TABS: 5.0000 mg | ORAL_TABLET | ORAL | Status: DC | PRN

## 2020-09-06 MED ORDER — CEFAZOLIN SODIUM-DEXTROSE 2-4 GM/100ML-% IV SOLN
2.0000 g | INTRAVENOUS | Status: AC
  Administered 2020-09-06: 2 g via INTRAVENOUS
  Filled 2020-09-06: qty 100

## 2020-09-06 MED ORDER — MIDAZOLAM HCL 2 MG/2ML IJ SOLN
1.0000 mg | INTRAMUSCULAR | Status: DC
  Administered 2020-09-06: 2 mg via INTRAVENOUS
  Filled 2020-09-06: qty 2

## 2020-09-06 MED ORDER — ACETAMINOPHEN 500 MG PO TABS
1000.0000 mg | ORAL_TABLET | Freq: Four times a day (QID) | ORAL | Status: AC
  Administered 2020-09-06 – 2020-09-07 (×4): 1000 mg via ORAL
  Filled 2020-09-06 (×4): qty 2

## 2020-09-06 MED ORDER — ALLOPURINOL 100 MG PO TABS
200.0000 mg | ORAL_TABLET | Freq: Every day | ORAL | Status: DC
  Administered 2020-09-07 – 2020-09-08 (×2): 200 mg via ORAL
  Filled 2020-09-06 (×2): qty 2

## 2020-09-06 MED ORDER — FENTANYL CITRATE (PF) 100 MCG/2ML IJ SOLN
25.0000 ug | INTRAMUSCULAR | Status: DC | PRN
Start: 2020-09-06 — End: 2020-09-06

## 2020-09-06 MED ORDER — DEXAMETHASONE SODIUM PHOSPHATE 10 MG/ML IJ SOLN
INTRAMUSCULAR | Status: AC
  Filled 2020-09-06: qty 1

## 2020-09-06 MED ORDER — DEXAMETHASONE SODIUM PHOSPHATE 10 MG/ML IJ SOLN: 8.0000 mg | Freq: Once | INTRAMUSCULAR | Status: DC

## 2020-09-06 MED ORDER — METOCLOPRAMIDE HCL 5 MG/ML IJ SOLN: 5.0000 mg | Freq: Three times a day (TID) | INTRAMUSCULAR | Status: DC | PRN

## 2020-09-06 MED ORDER — DEXAMETHASONE SODIUM PHOSPHATE 10 MG/ML IJ SOLN
INTRAMUSCULAR | Status: DC | PRN
  Administered 2020-09-06: 5 mg

## 2020-09-06 MED ORDER — ROPIVACAINE HCL 5 MG/ML IJ SOLN
INTRAMUSCULAR | Status: DC | PRN
  Administered 2020-09-06: 20 mL via PERINEURAL

## 2020-09-06 MED ORDER — BUPIVACAINE IN DEXTROSE 0.75-8.25 % IT SOLN
INTRATHECAL | Status: DC | PRN
  Administered 2020-09-06: 1.6 mL via INTRATHECAL

## 2020-09-06 MED ORDER — SODIUM CHLORIDE (PF) 0.9 % IJ SOLN
INTRAMUSCULAR | Status: AC
  Filled 2020-09-06: qty 60

## 2020-09-06 MED ORDER — PROPOFOL 500 MG/50ML IV EMUL
INTRAVENOUS | Status: AC
  Filled 2020-09-06: qty 50

## 2020-09-06 MED ORDER — PROPOFOL 10 MG/ML IV BOLUS
INTRAVENOUS | Status: AC
  Filled 2020-09-06: qty 20

## 2020-09-06 MED ORDER — TRANEXAMIC ACID-NACL 1000-0.7 MG/100ML-% IV SOLN
1000.0000 mg | INTRAVENOUS | Status: AC
  Administered 2020-09-06: 1000 mg via INTRAVENOUS
  Filled 2020-09-06: qty 100

## 2020-09-06 MED ORDER — FLEET ENEMA 7-19 GM/118ML RE ENEM: 1.0000 | ENEMA | Freq: Once | RECTAL | Status: DC | PRN

## 2020-09-06 MED ORDER — ONDANSETRON HCL 4 MG PO TABS: 4.0000 mg | ORAL_TABLET | Freq: Four times a day (QID) | ORAL | Status: DC | PRN

## 2020-09-06 MED ORDER — PROPOFOL 500 MG/50ML IV EMUL
INTRAVENOUS | Status: DC | PRN
  Administered 2020-09-06: 75 ug/kg/min via INTRAVENOUS

## 2020-09-06 MED ORDER — ONDANSETRON HCL 4 MG/2ML IJ SOLN
INTRAMUSCULAR | Status: DC | PRN
  Administered 2020-09-06: 4 mg via INTRAVENOUS

## 2020-09-06 MED ORDER — SODIUM CHLORIDE 0.9 % IR SOLN
Status: DC | PRN
  Administered 2020-09-06: 3000 mL

## 2020-09-06 MED ORDER — SODIUM CHLORIDE (PF) 0.9 % IJ SOLN
INTRAMUSCULAR | Status: DC | PRN
  Administered 2020-09-06: 60 mL via INTRAVENOUS

## 2020-09-06 MED ORDER — METOCLOPRAMIDE HCL 5 MG PO TABS: 5.0000 mg | ORAL_TABLET | Freq: Three times a day (TID) | ORAL | Status: DC | PRN

## 2020-09-06 MED ORDER — METHOCARBAMOL 500 MG PO TABS
500.0000 mg | ORAL_TABLET | Freq: Four times a day (QID) | ORAL | Status: DC | PRN
  Administered 2020-09-06 – 2020-09-08 (×3): 500 mg via ORAL
  Filled 2020-09-06 (×4): qty 1

## 2020-09-06 MED ORDER — BISACODYL 10 MG RE SUPP: 10.0000 mg | Freq: Every day | RECTAL | Status: DC | PRN

## 2020-09-06 MED ORDER — ONDANSETRON HCL 4 MG/2ML IJ SOLN
INTRAMUSCULAR | Status: AC
  Filled 2020-09-06: qty 2

## 2020-09-06 MED ORDER — MENTHOL 3 MG MT LOZG: 1.0000 | LOZENGE | OROMUCOSAL | Status: DC | PRN

## 2020-09-06 MED ORDER — DIPHENHYDRAMINE HCL 12.5 MG/5ML PO ELIX: 12.5000 mg | ORAL_SOLUTION | ORAL | Status: DC | PRN

## 2020-09-06 MED ORDER — LOSARTAN POTASSIUM 25 MG PO TABS
25.0000 mg | ORAL_TABLET | Freq: Every day | ORAL | Status: DC
  Administered 2020-09-07: 25 mg via ORAL
  Filled 2020-09-06: qty 1

## 2020-09-06 MED ORDER — ORAL CARE MOUTH RINSE
15.0000 mL | Freq: Once | OROMUCOSAL | Status: AC
  Administered 2020-09-06: 15 mL via OROMUCOSAL

## 2020-09-06 MED ORDER — ASPIRIN EC 325 MG PO TBEC
325.0000 mg | DELAYED_RELEASE_TABLET | Freq: Two times a day (BID) | ORAL | Status: DC
  Administered 2020-09-07 (×2): 325 mg via ORAL
  Filled 2020-09-06 (×2): qty 1

## 2020-09-06 MED ORDER — MORPHINE SULFATE (PF) 2 MG/ML IV SOLN: 1.0000 mg | INTRAVENOUS | Status: DC | PRN

## 2020-09-06 MED ORDER — POLYETHYLENE GLYCOL 3350 17 G PO PACK: 17.0000 g | PACK | Freq: Every day | ORAL | Status: DC | PRN

## 2020-09-06 MED ORDER — FENTANYL CITRATE (PF) 100 MCG/2ML IJ SOLN
50.0000 ug | INTRAMUSCULAR | Status: DC
  Administered 2020-09-06: 50 ug via INTRAVENOUS
  Filled 2020-09-06: qty 2

## 2020-09-06 MED ORDER — BUPIVACAINE LIPOSOME 1.3 % IJ SUSP
INTRAMUSCULAR | Status: DC | PRN
  Administered 2020-09-06: 20 mL

## 2020-09-06 MED ORDER — DOCUSATE SODIUM 100 MG PO CAPS
100.0000 mg | ORAL_CAPSULE | Freq: Two times a day (BID) | ORAL | Status: DC
  Administered 2020-09-06 – 2020-09-08 (×4): 100 mg via ORAL
  Filled 2020-09-06 (×4): qty 1

## 2020-09-06 MED ORDER — PROPOFOL 10 MG/ML IV BOLUS
INTRAVENOUS | Status: DC | PRN
  Administered 2020-09-06: 30 mg via INTRAVENOUS
  Administered 2020-09-06: 20 mg via INTRAVENOUS

## 2020-09-06 MED ORDER — CEFAZOLIN SODIUM-DEXTROSE 2-4 GM/100ML-% IV SOLN
2.0000 g | Freq: Four times a day (QID) | INTRAVENOUS | Status: AC
Start: 2020-09-06 — End: 2020-09-06
  Administered 2020-09-06 (×2): 2 g via INTRAVENOUS
  Filled 2020-09-06 (×2): qty 100

## 2020-09-06 MED ORDER — DOXAZOSIN MESYLATE 8 MG PO TABS
8.0000 mg | ORAL_TABLET | Freq: Every day | ORAL | Status: DC
  Administered 2020-09-06 – 2020-09-07 (×2): 8 mg via ORAL
  Filled 2020-09-06 (×2): qty 1

## 2020-09-06 MED ORDER — PHENOL 1.4 % MT LIQD: 1.0000 | OROMUCOSAL | Status: DC | PRN

## 2020-09-06 MED ORDER — POVIDONE-IODINE 10 % EX SWAB
2.0000 "application " | Freq: Once | CUTANEOUS | Status: AC
  Administered 2020-09-06: 2 via TOPICAL

## 2020-09-06 SURGICAL SUPPLY — 54 items
ATTUNE MED DOME PAT 41 KNEE (Knees) ×2 IMPLANT
ATTUNE PS FEM LT SZ 7 CEM KNEE (Femur) ×2 IMPLANT
ATTUNE PSRP INSR SZ7 10 KNEE (Insert) ×2 IMPLANT
BAG ZIPLOCK 12X15 (MISCELLANEOUS) ×2 IMPLANT
BASE TIBIAL ROT PLAT SZ 8 KNEE (Knees) ×1 IMPLANT
BLADE SAG 18X100X1.27 (BLADE) ×2 IMPLANT
BLADE SAW SGTL 11.0X1.19X90.0M (BLADE) ×2 IMPLANT
BNDG ELASTIC 6X5.8 VLCR STR LF (GAUZE/BANDAGES/DRESSINGS) ×2 IMPLANT
BOWL SMART MIX CTS (DISPOSABLE) ×2 IMPLANT
CEMENT HV SMART SET (Cement) ×4 IMPLANT
CLSR STERI-STRIP ANTIMIC 1/2X4 (GAUZE/BANDAGES/DRESSINGS) ×2 IMPLANT
COVER SURGICAL LIGHT HANDLE (MISCELLANEOUS) ×2 IMPLANT
COVER WAND RF STERILE (DRAPES) IMPLANT
CUFF TOURN SGL QUICK 34 (TOURNIQUET CUFF) ×2
CUFF TRNQT CYL 34X4.125X (TOURNIQUET CUFF) ×1 IMPLANT
DECANTER SPIKE VIAL GLASS SM (MISCELLANEOUS) ×2 IMPLANT
DRAPE U-SHAPE 47X51 STRL (DRAPES) ×2 IMPLANT
DRSG AQUACEL AG ADV 3.5X10 (GAUZE/BANDAGES/DRESSINGS) ×2 IMPLANT
DURAPREP 26ML APPLICATOR (WOUND CARE) ×2 IMPLANT
ELECT REM PT RETURN 15FT ADLT (MISCELLANEOUS) ×2 IMPLANT
GLOVE BIO SURGEON STRL SZ8 (GLOVE) ×2 IMPLANT
GLOVE BIOGEL PI IND STRL 8.5 (GLOVE) ×1 IMPLANT
GLOVE BIOGEL PI INDICATOR 8.5 (GLOVE) ×1
GLOVE SRG 8 PF TXTR STRL LF DI (GLOVE) ×1 IMPLANT
GLOVE SURG ENC MOIS LTX SZ6 (GLOVE) IMPLANT
GLOVE SURG ENC MOIS LTX SZ7 (GLOVE) ×2 IMPLANT
GLOVE SURG UNDER POLY LF SZ6.5 (GLOVE) ×2 IMPLANT
GLOVE SURG UNDER POLY LF SZ8 (GLOVE) ×2
GOWN STRL REUS W/TWL LRG LVL3 (GOWN DISPOSABLE) ×4 IMPLANT
GOWN STRL REUS W/TWL XL LVL3 (GOWN DISPOSABLE) ×2 IMPLANT
HANDPIECE INTERPULSE COAX TIP (DISPOSABLE) ×2
HOLDER FOLEY CATH W/STRAP (MISCELLANEOUS) IMPLANT
IMMOBILIZER KNEE 20 (SOFTGOODS) ×2
IMMOBILIZER KNEE 20 THIGH 36 (SOFTGOODS) ×1 IMPLANT
KIT TURNOVER KIT A (KITS) IMPLANT
MANIFOLD NEPTUNE II (INSTRUMENTS) ×2 IMPLANT
NS IRRIG 1000ML POUR BTL (IV SOLUTION) ×2 IMPLANT
PACK TOTAL KNEE CUSTOM (KITS) ×2 IMPLANT
PADDING CAST COTTON 6X4 STRL (CAST SUPPLIES) ×2 IMPLANT
PENCIL SMOKE EVACUATOR (MISCELLANEOUS) ×2 IMPLANT
PIN DRILL FIX HALF THREAD (BIT) ×2 IMPLANT
PIN STEINMAN FIXATION KNEE (PIN) ×2 IMPLANT
PROTECTOR NERVE ULNAR (MISCELLANEOUS) ×2 IMPLANT
SET HNDPC FAN SPRY TIP SCT (DISPOSABLE) ×1 IMPLANT
STRIP CLOSURE SKIN 1/2X4 (GAUZE/BANDAGES/DRESSINGS) ×4 IMPLANT
SUT MNCRL AB 4-0 PS2 18 (SUTURE) ×2 IMPLANT
SUT STRATAFIX 0 PDS 27 VIOLET (SUTURE) ×2
SUT VIC AB 2-0 CT1 27 (SUTURE) ×6
SUT VIC AB 2-0 CT1 TAPERPNT 27 (SUTURE) ×3 IMPLANT
SUTURE STRATFX 0 PDS 27 VIOLET (SUTURE) ×1 IMPLANT
TIBIAL BASE ROT PLAT SZ 8 KNEE (Knees) ×2 IMPLANT
TRAY FOLEY MTR SLVR 16FR STAT (SET/KITS/TRAYS/PACK) ×2 IMPLANT
WATER STERILE IRR 1000ML POUR (IV SOLUTION) ×4 IMPLANT
WRAP KNEE MAXI GEL POST OP (GAUZE/BANDAGES/DRESSINGS) ×2 IMPLANT

## 2020-09-06 NOTE — Progress Notes (Signed)
Assisted Dr. Chelsey Woodrum with left, ultrasound guided, adductor canal block. Side rails up, monitors on throughout procedure. See vital signs in flow sheet. Tolerated Procedure well.  

## 2020-09-06 NOTE — Transfer of Care (Signed)
Immediate Anesthesia Transfer of Care Note  Patient: Jacob Mayo  Procedure(s) Performed: TOTAL KNEE ARTHROPLASTY (Left Knee)  Patient Location: PACU  Anesthesia Type:Spinal  Level of Consciousness: drowsy  Airway & Oxygen Therapy: Patient Spontanous Breathing and Patient connected to face mask oxygen  Post-op Assessment: Report given to RN and Post -op Vital signs reviewed and stable  Post vital signs: Reviewed and stable  Last Vitals:  Vitals Value Taken Time  BP 103/74 09/06/20 1157  Temp    Pulse 45 09/06/20 1159  Resp 10 09/06/20 1159  SpO2 98 % 09/06/20 1159  Vitals shown include unvalidated device data.  Last Pain:  Vitals:   09/06/20 0746  TempSrc: Oral         Complications: No complications documented.

## 2020-09-06 NOTE — Interval H&P Note (Signed)
History and Physical Interval Note:  09/06/2020 7:46 AM  Jacob Mayo  has presented today for surgery, with the diagnosis of Left knee osteoarthritis.  The various methods of treatment have been discussed with the patient and family. After consideration of risks, benefits and other options for treatment, the patient has consented to  Procedure(s) with comments: TOTAL KNEE ARTHROPLASTY (Left) - as a surgical intervention.  The patient's history has been reviewed, patient examined, no change in status, stable for surgery.  I have reviewed the patient's chart and labs.  Questions were answered to the patient's satisfaction.     Homero Fellers Tyronda Vizcarrondo

## 2020-09-06 NOTE — Op Note (Signed)
OPERATIVE REPORT-TOTAL KNEE ARTHROPLASTY   Pre-operative diagnosis- Osteoarthritis  Left knee(s)  Post-operative diagnosis- Osteoarthritis Left knee(s)  Procedure-  Left  Total Knee Arthroplasty  Surgeon- Gus Rankin. Ayslin Kundert, MD  Assistant- Leilani Able, PA-C   Anesthesia-  Adductor canal block and spinal  EBL- 25 ml '  Drains None  Tourniquet time-  Total Tourniquet Time Documented: Thigh (Left) - 36 minutes Total: Thigh (Left) - 36 minutes     Complications- None  Condition-PACU - hemodynamically stable.   Brief Clinical Note   Jacob Mayo is a 70 y.o. year old male with end stage OA of his left knee with progressively worsening pain and dysfunction. He has constant pain, with activity and at rest and significant functional deficits with difficulties even with ADLs. He has had extensive non-op management including analgesics, injections of cortisone, and home exercise program, but remains in significant pain with significant dysfunction. Radiographs show bone on bone arthritis lateral and patellofemoral. He presents now for left Total Knee Arthroplasty.    Procedure in detail---   The patient is brought into the operating room and positioned supine on the operating table. After successful administration of  Adductor canal block and spinal,   a tourniquet is placed high on the  Left thigh(s) and the lower extremity is prepped and draped in the usual sterile fashion. Time out is performed by the operating team and then the  Left lower extremity is wrapped in Esmarch, knee flexed and the tourniquet inflated to 300 mmHg.       A midline incision is made with a ten blade through the subcutaneous tissue to the level of the extensor mechanism. A fresh blade is used to make a medial parapatellar arthrotomy. Soft tissue over the proximal medial tibia is subperiosteally elevated to the joint line with a knife and into the semimembranosus bursa with a Cobb elevator. Soft tissue over the  proximal lateral tibia is elevated with attention being paid to avoiding the patellar tendon on the tibial tubercle. The patella is everted, knee flexed 90 degrees and the ACL and PCL are removed. Findings are bone on bone lateral and patellofemoral with massive global osteophytes.        The drill is used to create a starting hole in the distal femur and the canal is thoroughly irrigated with sterile saline to remove the fatty contents. The 5 degree Left  valgus alignment guide is placed into the femoral canal and the distal femoral cutting block is pinned to remove 9 mm off the distal femur. Resection is made with an oscillating saw.      The tibia is subluxed forward and the menisci are removed. The extramedullary alignment guide is placed referencing proximally at the medial aspect of the tibial tubercle and distally along the second metatarsal axis and tibial crest. The block is pinned to remove 29mm off the more deficient lateral  side. Resection is made with an oscillating saw. Size 8is the most appropriate size for the tibia and the proximal tibia is prepared with the modular drill and keel punch for that size.      The femoral sizing guide is placed and size 7 is most appropriate. Rotation is marked off the epicondylar axis and confirmed by creating a rectangular flexion gap at 90 degrees. The size 7 cutting block is pinned in this rotation and the anterior, posterior and chamfer cuts are made with the oscillating saw. The intercondylar block is then placed and that cut is made.  Trial size 8 tibial component, trial size 7 posterior stabilized femur and a 10  mm posterior stabilized rotating platform insert trial is placed. Full extension is achieved with excellent varus/valgus and anterior/posterior balance throughout full range of motion. The patella is everted and thickness measured to be 27  mm. Free hand resection is taken to 15 mm, a 41 template is placed, lug holes are drilled, trial patella  is placed, and it tracks normally. Osteophytes are removed off the posterior femur with the trial in place. All trials are removed and the cut bone surfaces prepared with pulsatile lavage. Cement is mixed and once ready for implantation, the size 8 tibial implant, size  7 posterior stabilized femoral component, and the size 41 patella are cemented in place and the patella is held with the clamp. The trial insert is placed and the knee held in full extension. The Exparel (20 ml mixed with 60 ml saline) is injected into the extensor mechanism, posterior capsule, medial and lateral gutters and subcutaneous tissues.  All extruded cement is removed and once the cement is hard the permanent 10 mm posterior stabilized rotating platform insert is placed into the tibial tray.      The wound is copiously irrigated with saline solution and the extensor mechanism closed with # 0 Stratofix suture. The tourniquet is released for a total tourniquet time of 36  minutes. Flexion against gravity is 140 degrees and the patella tracks normally. Subcutaneous tissue is closed with 2.0 vicryl and subcuticular with running 4.0 Monocryl. The incision is cleaned and dried and steri-strips and a bulky sterile dressing are applied. The limb is placed into a knee immobilizer and the patient is awakened and transported to recovery in stable condition.      Please note that a surgical assistant was a medical necessity for this procedure in order to perform it in a safe and expeditious manner. Surgical assistant was necessary to retract the ligaments and vital neurovascular structures to prevent injury to them and also necessary for proper positioning of the limb to allow for anatomic placement of the prosthesis.   Gus Rankin Jacob Vanwagoner, MD    09/06/2020, 11:30 AM

## 2020-09-06 NOTE — Anesthesia Procedure Notes (Signed)
Anesthesia Regional Block: Adductor canal block   Pre-Anesthetic Checklist: ,, timeout performed, Correct Patient, Correct Site, Correct Laterality, Correct Procedure, Correct Position, site marked, Risks and benefits discussed,  Surgical consent,  Pre-op evaluation,  At surgeon's request and post-op pain management  Laterality: Left  Prep: Maximum Sterile Barrier Precautions used, chloraprep       Needles:  Injection technique: Single-shot  Needle Type: Echogenic Stimulator Needle     Needle Length: 9cm  Needle Gauge: 22     Additional Needles:   Procedures:,,,, ultrasound used (permanent image in chart),,,,  Narrative:  Start time: 09/06/2020 9:50 AM End time: 09/06/2020 10:00 AM Injection made incrementally with aspirations every 5 mL.  Performed by: Personally  Anesthesiologist: Elmer Picker, MD  Additional Notes: Monitors applied. No increased pain on injection. No increased resistance to injection. Injection made in 5cc increments. Good needle visualization. Patient tolerated procedure well.

## 2020-09-06 NOTE — Anesthesia Procedure Notes (Signed)
Spinal  Patient location during procedure: OR Start time: 09/06/2020 10:20 AM End time: 09/06/2020 10:24 AM Staffing Performed: anesthesiologist  Anesthesiologist: Elmer Picker, MD Preanesthetic Checklist Completed: patient identified, IV checked, risks and benefits discussed, surgical consent, monitors and equipment checked, pre-op evaluation and timeout performed Spinal Block Patient position: sitting Prep: DuraPrep and site prepped and draped Patient monitoring: cardiac monitor, continuous pulse ox and blood pressure Approach: midline Location: L3-4 Injection technique: single-shot Needle Needle type: Pencan  Needle gauge: 24 G Needle length: 9 cm Assessment Sensory level: T6 Additional Notes Functioning IV was confirmed and monitors were applied. Sterile prep and drape, including hand hygiene and sterile gloves were used. The patient was positioned and the spine was prepped. The skin was anesthetized with lidocaine.  Free flow of clear CSF was obtained prior to injecting local anesthetic into the CSF.  The spinal needle aspirated freely following injection.  The needle was carefully withdrawn.  The patient tolerated the procedure well.

## 2020-09-06 NOTE — Anesthesia Preprocedure Evaluation (Addendum)
Anesthesia Evaluation  Patient identified by MRN, date of birth, ID band Patient awake    Reviewed: Allergy & Precautions, NPO status , Patient's Chart, lab work & pertinent test results  Airway Mallampati: I  TM Distance: >3 FB Neck ROM: Full    Dental  (+) Teeth Intact, Dental Advisory Given   Pulmonary sleep apnea (does not wear CPAP) , COPD,    Pulmonary exam normal breath sounds clear to auscultation       Cardiovascular hypertension, Pt. 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)  negative genitourinary   Musculoskeletal negative musculoskeletal ROS (+)   Abdominal   Peds  Hematology negative hematology ROS (+)   Anesthesia Other Findings   Reproductive/Obstetrics                           Anesthesia Physical Anesthesia Plan  ASA: III  Anesthesia Plan: Spinal and Regional   Post-op Pain Management:  Regional for Post-op pain   Induction:   PONV Risk Score and Plan: Treatment may vary due to age or medical condition, Propofol infusion, Ondansetron and Dexamethasone  Airway Management Planned: Natural Airway  Additional Equipment:   Intra-op Plan:   Post-operative Plan:   Informed Consent: I have reviewed the patients History and Physical, chart, labs and discussed the procedure including the risks, benefits and alternatives for the proposed anesthesia with the patient or authorized representative who has indicated his/her understanding and acceptance.     Dental advisory given  Plan Discussed with: CRNA  Anesthesia Plan Comments:         Anesthesia Quick Evaluation

## 2020-09-06 NOTE — Discharge Instructions (Addendum)
Jacob Arabian, MD Total Joint Specialist [facility] [place] [address], [address] [place], [place] [address] [phone]  TOTAL KNEE REPLACEMENT POSTOPERATIVE DIRECTIONS    Knee Rehabilitation, Guidelines Following Surgery  Results after knee surgery are often greatly improved when you follow the exercise, range of motion and muscle strengthening exercises prescribed by your doctor. Safety measures are also important to protect the knee from further injury. If any of these exercises cause you to have increased pain or swelling in your knee joint, decrease the amount until you are comfortable again and slowly increase them. If you have problems or questions, call your caregiver or physical therapist for advice.   BLOOD CLOT PREVENTION . Take a 10 mg Xarelto once a day for three weeks following surgery to prevent blood clots.   HOME CARE INSTRUCTIONS  . Remove items at home which could result in a fall. This includes throw rugs or furniture in walking pathways.  . ICE to the affected knee as much as tolerated. Icing helps control swelling. If the swelling is well controlled you will be more comfortable and rehab easier. Continue to use ice on the knee for pain and swelling from surgery. You may notice swelling that will progress down to the foot and ankle. This is normal after surgery. Elevate the leg when you are not up walking on it.    . Continue to use the breathing machine which will help keep your temperature down. It is common for your temperature to cycle up and down following surgery, especially at night when you are not up moving around and exerting yourself. The breathing machine keeps your lungs expanded and your temperature down. . Do not place pillow under the operative knee, focus on keeping the knee straight while resting  DIET You may resume your previous home diet once you are discharged from the hospital.  DRESSING / [place] / SHOWERING . Keep your  bulky bandage on for 2 days. On the third post-operative day you may remove the Ace bandage and gauze. There is a waterproof adhesive bandage on your skin which will stay in place until your first follow-up appointment. Once you remove this you will not need to place another bandage . You may begin showering 3 days following surgery, but do not submerge the incision under water.  ACTIVITY For the first 5 days, the key is rest and control of pain and swelling . Do your home exercises twice a day starting on post-operative day 3. On the days you go to physical therapy, just do the home exercises once that day. . You should rest, ice and elevate the leg for 50 minutes out of every hour. Get up and walk/stretch for 10 minutes per hour. After 5 days you can increase your activity slowly as tolerated. . Walk with your walker as instructed. Use the walker until you are comfortable transitioning to a cane. Walk with the cane in the opposite hand of the operative leg. You may discontinue the cane once you are comfortable and walking steadily. . Avoid periods of inactivity such as sitting longer than an hour when not asleep. This helps prevent blood clots.  . You may discontinue the knee immobilizer once you are able to perform a straight leg raise while lying down. . You may resume a sexual relationship in one month or when given the OK by your doctor.  . You may return to work once you are cleared by your doctor.  . Do not  drive a car for 6 weeks or until released by your surgeon.  . Do not drive while taking narcotics.  TED HOSE STOCKINGS Wear the elastic stockings on both legs for three weeks following surgery during the day. You may remove them at night for sleeping.  WEIGHT BEARING Weight bearing as tolerated with assist device (walker, cane, etc) as directed, use it as long as suggested by your surgeon or therapist, typically at least 4-6 weeks.  POSTOPERATIVE CONSTIPATION PROTOCOL Constipation -  defined medically as fewer than three stools per week and severe constipation as less than one stool per week.  One of the most common issues patients have following surgery is constipation.  Even if you have a regular bowel pattern at home, your normal regimen is likely to be disrupted due to multiple reasons following surgery.  Combination of anesthesia, postoperative narcotics, change in appetite and fluid intake all can affect your bowels.  In order to avoid complications following surgery, here are some recommendations in order to help you during your recovery period.  . Colace (docusate) - Pick up an over-the-counter form of Colace or another stool softener and take twice a day as long as you are requiring postoperative pain medications.  Take with a full glass of water daily.  If you experience loose stools or diarrhea, hold the colace until you stool forms back up. If your symptoms do not get better within 1 week or if they get worse, check with your doctor. . Dulcolax (bisacodyl) - Pick up over-the-counter and take as directed by the product packaging as needed to assist with the movement of your bowels.  Take with a full glass of water.  Use this product as needed if not relieved by Colace only.  . MiraLax (polyethylene glycol) - Pick up over-the-counter to have on hand. MiraLax is a solution that will increase the amount of water in your bowels to assist with bowel movements.  Take as directed and can mix with a glass of water, juice, soda, coffee, or tea. Take if you go more than two days without a movement. Do not use MiraLax more than once per day. Call your doctor if you are still constipated or irregular after using this medication for 7 days in a row.  If you continue to have problems with postoperative constipation, please contact the office for further assistance and recommendations.  If you experience "the worst abdominal pain ever" or develop nausea or vomiting, please contact the office  immediatly for further recommendations for treatment.  ITCHING If you experience itching with your medications, try taking only a single pain pill, or even half a pain pill at a time.  You can also use Benadryl over the counter for itching or also to help with sleep.   MEDICATIONS See your medication summary on the "After Visit Summary" that the nursing staff will review with you prior to discharge.  You may have some home medications which will be placed on hold until you complete the course of blood thinner medication.  It is important for you to complete the blood thinner medication as prescribed by your surgeon.  Continue your approved medications as instructed at time of discharge.  PRECAUTIONS . If you experience chest pain or shortness of breath - call 911 immediately for transfer to the hospital emergency department.  . If you develop a fever greater that 101 F, purulent drainage from wound, increased redness or drainage from wound, foul odor from the wound/dressing, or calf pain -  CONTACT YOUR SURGEON.                                                   FOLLOW-UP APPOINTMENTS Make sure you keep all of your appointments after your operation with your surgeon and caregivers. You should call the office at the above phone number and make an appointment for approximately two weeks after the date of your surgery or on the date instructed by your surgeon outlined in the "After Visit Summary".  RANGE OF MOTION AND STRENGTHENING EXERCISES  Rehabilitation of the knee is important following a knee injury or an operation. After just a few days of immobilization, the muscles of the thigh which control the knee become weakened and shrink (atrophy). Knee exercises are designed to build up the tone and strength of the thigh muscles and to improve knee motion. Often times heat used for twenty to thirty minutes before working out will loosen up your tissues and help with improving the range of motion but do not  use heat for the first two weeks following surgery. These exercises can be done on a training (exercise) mat, on the floor, on a table or on a bed. Use what ever works the best and is most comfortable for you Knee exercises include:  . Leg Lifts - While your knee is still immobilized in a splint or cast, you can do straight leg raises. Lift the leg to 60 degrees, hold for 3 sec, and slowly lower the leg. Repeat 10-20 times 2-3 times daily. Perform this exercise against resistance later as your knee gets better.  Isla Pence and Hamstring Sets - Tighten up the muscle on the front of the thigh (Quad) and hold for 5-10 sec. Repeat this 10-20 times hourly. Hamstring sets are done by pushing the foot backward against an object and holding for 5-10 sec. Repeat as with quad sets.   Leg Slides: Lying on your back, slowly slide your foot toward your buttocks, bending your knee up off the floor (only go as far as is comfortable). Then slowly slide your foot back down until your leg is flat on the floor again.  Angel Wings: Lying on your back spread your legs to the side as far apart as you can without causing discomfort.  A rehabilitation program following serious knee injuries can speed recovery and prevent re-injury in the future due to weakened muscles. Contact your doctor or a physical therapist for more information on knee rehabilitation.   IF YOU ARE TRANSFERRED TO A SKILLED REHAB FACILITY If the patient is transferred to a skilled rehab facility following release from the hospital, a list of the current medications will be sent to the facility for the patient to continue.  When discharged from the skilled rehab facility, please have the facility set up the patient's Home Health Physical Therapy prior to being released. Also, the skilled facility will be responsible for providing the patient with their medications at time of release from the facility to include their pain medication, the muscle relaxants, and their  blood thinner medication. If the patient is still at the rehab facility at time of the two week follow up appointment, the skilled rehab facility will also need to assist the patient in arranging follow up appointment in our office and any transportation needs.  MAKE SURE YOU:  . Understand these  instructions.  . Get help right away if you are not doing well or get worse.   DENTAL ANTIBIOTICS:  In most cases prophylactic antibiotics for Dental procdeures after total joint surgery are not necessary.  Exceptions are as follows:  1. History of prior total joint infection  2. Severely immunocompromised (Organ Transplant, cancer chemotherapy, Rheumatoid biologic meds such as Humera)  3. Poorly controlled diabetes (A1C &gt; 8.0, blood glucose over 200)  If you have one of these conditions, contact your surgeon for an antibiotic prescription, prior to your dental procedure.    Pick up stool softner and laxative for home use following surgery while on pain medications. Do not submerge incision under water. Please use good hand washing techniques while changing dressing each day. May shower starting three days after surgery. Please use a clean towel to pat the incision dry following showers. Continue to use ice for pain and swelling after surgery. Do not use any lotions or creams on the incision until instructed by your surgeon.  Information on my medicine - XARELTO (Rivaroxaban)  This medication education was reviewed with me or my healthcare representative as part of my discharge preparation.  The pharmacist that spoke with me during my hospital stay was:  Valentina [facility]  Why was Xarelto prescribed for you? Xarelto was prescribed for you to reduce the risk of blood clots forming after orthopedic surgery. The medical term for these abnormal blood clots is venous thromboembolism (VTE).  What do you need to know about xarelto ? Take your Xarelto ONCE DAILY at the same time  every day. You may take it either with or without food.  If you have difficulty swallowing the tablet whole, you may crush it and mix in applesauce just prior to taking your dose.  Take Xarelto exactly as prescribed by your doctor and DO NOT stop taking Xarelto without talking to the doctor who prescribed the medication.  Stopping without other VTE prevention medication to take the place of Xarelto may increase your risk of developing a clot.  After discharge, you should have regular check-up appointments with your healthcare provider that is prescribing your Xarelto.    What do you do if you miss a dose? If you miss a dose, take it as soon as you remember on the same day then continue your regularly scheduled once daily regimen the next day. Do not take two doses of Xarelto on the same day.   Important Safety Information A possible side effect of Xarelto is bleeding. You should call your healthcare provider right away if you experience any of the following: ? Bleeding from an injury or your nose that does not stop. ? Unusual colored urine (red or dark brown) or unusual colored stools (red or black). ? Unusual bruising for unknown reasons. ? A serious fall or if you hit your head (even if there is no bleeding).  Some medicines may interact with Xarelto and might increase your risk of bleeding while on Xarelto. To help avoid this, consult your healthcare provider or pharmacist prior to using any new prescription or non-prescription medications, including herbals, vitamins, non-steroidal anti-inflammatory drugs (NSAIDs) and supplements.  This website has more information on Xarelto: VisitDestination.com.br.

## 2020-09-06 NOTE — Anesthesia Postprocedure Evaluation (Signed)
Anesthesia Post Note  Patient: Jacob Mayo  Procedure(s) Performed: TOTAL KNEE ARTHROPLASTY (Left Knee)     Patient location during evaluation: PACU Anesthesia Type: Regional and Spinal Level of consciousness: oriented and awake and alert Pain management: pain level controlled Vital Signs Assessment: post-procedure vital signs reviewed and stable Respiratory status: spontaneous breathing, respiratory function stable and patient connected to nasal cannula oxygen Cardiovascular status: blood pressure returned to baseline and stable Postop Assessment: no headache, no backache and no apparent nausea or vomiting Anesthetic complications: no   No complications documented.  Last Vitals:  Vitals:   09/06/20 1340 09/06/20 1503  BP: (!) 142/72 135/83  Pulse: (!) 51 64  Resp: 16 17  Temp: 36.5 C 36.5 C  SpO2: 95% 98%    Last Pain:  Vitals:   09/06/20 1503  TempSrc: Oral  PainSc:                  Analysia Dungee L Jorge Retz

## 2020-09-06 NOTE — Evaluation (Addendum)
Physical Therapy Evaluation Patient Details Name: LUCY WOOLEVER MRN: [id number] DOB: [birth year] Today's Date: 09/06/2020   History of Present Illness  70 yo male s/p L TKA 09/07/2019  Clinical Impression  On eval POD 0, pt required Min assist for mobility. He walked ~40 feet with a RW. Moderate pain with activity. Will plan to progress activity as tolerated. Wife stated she is only able to minimal physical assistance. Plan is for home with OP PT once ready.     Follow Up Recommendations Follow surgeon's recommendation for DC plan and follow-up therapies;Supervision/Assistance - 24 hour    Equipment Recommendations  Rolling walker with 5" wheels;3in1 (PT)    Recommendations for Other Services       Precautions / Restrictions Precautions Precautions: Fall;Knee Required Braces or Orthoses: Knee Immobilizer - Left Knee Immobilizer - Left: Discontinue once straight leg raise with < 10 degree lag Restrictions Weight Bearing Restrictions: No Other Position/Activity Restrictions: WBAT      Mobility  Bed Mobility Overal bed mobility: Needs Assistance Bed Mobility: Supine to Sit     Supine to sit: Min assist;HOB elevated     General bed mobility comments: Assist for L LE. Cues for safety, technique.    Transfers Overall transfer level: Needs assistance Equipment used: Rolling walker (2 wheeled) Transfers: Sit to/from Stand Sit to Stand: Min assist;From elevated surface         General transfer comment: Assist to rise, stabilize, control descent. VCs safety, technique, hand/LE placement.  Ambulation/Gait Ambulation/Gait assistance: Min assist Gait Distance (Feet): 35 Feet Assistive device: Rolling walker (2 wheeled) Gait Pattern/deviations: Step-to pattern     General Gait Details: VCs safety, sequence, technique, RW proximity, step length. Slow gait speed. Fairly steady with RW. Followed with recliner and used it to transport pt back to the room.  Stairs             Wheelchair Mobility    Modified Rankin (Stroke Patients Only)       Balance Overall balance assessment: Needs assistance         Standing balance support: Bilateral upper extremity supported Standing balance-Leahy Scale: Poor                               Pertinent Vitals/Pain Pain Assessment: 0-10 Pain Score: 5  Pain Location: L knee Pain Descriptors / Indicators: Discomfort;Sore;Aching Pain Intervention(s): Limited activity within patient's tolerance;Ice applied;Monitored during session    Home Living Family/patient expects to be discharged to:: Private residence Living Arrangements: Spouse/significant other Available Help at Discharge: Family Type of Home: House Home Access: Stairs to enter Entrance Stairs-Rails: Doctor, general practice of Steps: 4 Home Layout: One level Home Equipment: None      Prior Function Level of Independence: Independent               Hand Dominance        Extremity/Trunk Assessment   Upper Extremity Assessment Upper Extremity Assessment: Generalized weakness    Lower Extremity Assessment Lower Extremity Assessment: Generalized weakness;LLE deficits/detail LLE Deficits / Details: Ext lag with SLR    Cervical / Trunk Assessment Cervical / Trunk Assessment: Normal  Communication   Communication: No difficulties  Cognition Arousal/Alertness: Awake/alert Behavior During Therapy: WFL for tasks assessed/performed Overall Cognitive Status: Within Functional Limits for tasks assessed  General Comments: very minimal conversation      General Comments      Exercises Total Joint Exercises Quad Sets: AAROM;Left;5 reps;Supine   Assessment/Plan    PT Assessment Patient needs continued PT services  PT Problem List Decreased strength;Decreased mobility;Decreased range of motion;Decreased activity tolerance;Decreased balance;Decreased knowledge of use of  DME;Pain       PT Treatment Interventions DME instruction;Gait training;Therapeutic activities;Therapeutic exercise;Patient/family education;Balance training;Stair training;Functional mobility training    PT Goals (Current goals can be found in the Care Plan section)  Acute Rehab PT Goals Patient Stated Goal: regain PLOF. home. PT Goal Formulation: With patient/family Time For Goal Achievement: 09/20/20 Potential to Achieve Goals: Good    Frequency 7X/week   Barriers to discharge        Co-evaluation               AM-PAC PT "6 Clicks" Mobility  Outcome Measure Help needed turning from your back to your side while in a flat bed without using bedrails?: A Little Help needed moving from lying on your back to sitting on the side of a flat bed without using bedrails?: A Little Help needed moving to and from a bed to a chair (including a wheelchair)?: A Little Help needed standing up from a chair using your arms (e.g., wheelchair or bedside chair)?: A Little Help needed to walk in hospital room?: A Little Help needed climbing 3-5 steps with a railing? : A Lot 6 Click Score: 17    End of Session Equipment Utilized During Treatment: Gait belt;Left knee immobilizer Activity Tolerance: Patient tolerated treatment well Patient left: in chair;with call bell/phone within reach;with family/visitor present   PT Visit Diagnosis: Pain;Other abnormalities of gait and mobility (R26.89) Pain - Right/Left: Left Pain - part of body: Knee    Time: 3810-1751 PT Time Calculation (min) (ACUTE ONLY): 26 min   Charges:   PT Evaluation $PT Eval Low Complexity: 1 Low PT Treatments $Gait Training: 8-22 mins          Faye Ramsay, PT Acute Rehabilitation  Office: [phone] Pager: [phone]

## 2020-09-07 ENCOUNTER — Encounter (HOSPITAL_COMMUNITY): Payer: Self-pay | Admitting: Orthopedic Surgery

## 2020-09-07 DIAGNOSIS — Z79899 Other long term (current) drug therapy: Secondary | ICD-10-CM | POA: Diagnosis not present

## 2020-09-07 DIAGNOSIS — I10 Essential (primary) hypertension: Secondary | ICD-10-CM | POA: Diagnosis not present

## 2020-09-07 DIAGNOSIS — M1712 Unilateral primary osteoarthritis, left knee: Secondary | ICD-10-CM | POA: Diagnosis not present

## 2020-09-07 DIAGNOSIS — M1710 Unilateral primary osteoarthritis, unspecified knee: Secondary | ICD-10-CM | POA: Diagnosis present

## 2020-09-07 LAB — CBC
HCT: 36.1 % — ABNORMAL LOW (ref 39.0–52.0)
Hemoglobin: 12.2 g/dL — ABNORMAL LOW (ref 13.0–17.0)
MCH: 32.3 pg (ref 26.0–34.0)
MCHC: 33.8 g/dL (ref 30.0–36.0)
MCV: 95.5 fL (ref 80.0–100.0)
Platelets: 208 10*3/uL (ref 150–400)
RBC: 3.78 MIL/uL — ABNORMAL LOW (ref 4.22–5.81)
RDW: 14.5 % (ref 11.5–15.5)
WBC: 10.8 10*3/uL — ABNORMAL HIGH (ref 4.0–10.5)
nRBC: 0 % (ref 0.0–0.2)

## 2020-09-07 LAB — BASIC METABOLIC PANEL
Anion gap: 8 (ref 5–15)
BUN: 24 mg/dL — ABNORMAL HIGH (ref 8–23)
CO2: 22 mmol/L (ref 22–32)
Calcium: 8.4 mg/dL — ABNORMAL LOW (ref 8.9–10.3)
Chloride: 109 mmol/L (ref 98–111)
Creatinine, Ser: 1.14 mg/dL (ref 0.61–1.24)
GFR, Estimated: >60 mL/min (ref >60)
Glucose, Bld: 132 mg/dL — ABNORMAL HIGH (ref 70–99)
Potassium: 4.9 mmol/L (ref 3.5–5.1)
Sodium: 139 mmol/L (ref 135–145)

## 2020-09-07 MED ORDER — TRAMADOL HCL 50 MG PO TABS
50.0000 mg | ORAL_TABLET | Freq: Four times a day (QID) | ORAL | 0 refills | Status: DC | PRN
Start: 2020-09-07 — End: 2021-01-10

## 2020-09-07 MED ORDER — OXYCODONE HCL 5 MG PO TABS: 5.0000 mg | ORAL_TABLET | Freq: Four times a day (QID) | ORAL | 0 refills | Status: DC | PRN

## 2020-09-07 MED ORDER — METHOCARBAMOL 500 MG PO TABS: 500.0000 mg | ORAL_TABLET | Freq: Four times a day (QID) | ORAL | 0 refills | Status: DC | PRN

## 2020-09-07 MED ORDER — GABAPENTIN 300 MG PO CAPS: ORAL_CAPSULE | ORAL | 0 refills | Status: DC

## 2020-09-07 MED ORDER — ASPIRIN 325 MG PO TBEC: 325.0000 mg | DELAYED_RELEASE_TABLET | Freq: Two times a day (BID) | ORAL | 0 refills | Status: DC

## 2020-09-07 NOTE — Progress Notes (Addendum)
Physical Therapy Treatment Patient Details Name: Jacob Mayo MRN: [id number] DOB: 22-Jun-1951 Today's Date: 09/07/2020    History of Present Illness 70 yo male s/p L TKA 09/07/2019    PT Comments    Pt was less steady this afternoon compared to this morning. He fatigued fairly easily with activity. LOB x 1 that required assist from therapist to prevent fall. BP WNL at end of session. Also, both pt and wife feel pt could benefit from another night's stay and another in house PT session on tomorrow. Pt's wife does not yet feel comfortable managing pt and she would like more education and practice. He has not quite met his PT goals. Made RN aware.     Follow Up Recommendations  Follow surgeon's recommendation for DC plan and follow-up therapies;Supervision/Assistance - 24 hour     Equipment Recommendations  Rolling walker with 5" wheels;3in1 (PT)    Recommendations for Other Services       Precautions / Restrictions Precautions Precautions: Fall;Knee Knee Immobilizer - Left: Discontinue once straight leg raise with < 10 degree lag Restrictions Weight Bearing Restrictions: No Other Position/Activity Restrictions: WBAT    Mobility  Bed Mobility Overal bed mobility: Needs Assistance Bed Mobility: Sit to Supine       Sit to supine: Min guard;HOB elevated   General bed mobility comments: Min guard for safety. Pt was able to hook L Le with R foot. Increased time.  Transfers Overall transfer level: Needs assistance Equipment used: Rolling walker (2 wheeled) Transfers: Sit to/from Stand Sit to Stand: From elevated surface;Min assist         General transfer comment: Assist to rise, stabilize, control descent. VCs safety, technique, hand/LE placement. Increased time.  Ambulation/Gait Ambulation/Gait assistance: Min assist Gait Distance (Feet): 125 Feet Assistive device: Rolling walker (2 wheeled) Gait Pattern/deviations: Step-to pattern;Trunk flexed;Decreased stride  length     General Gait Details: VCs safety, sequence, technique, step length, RW proximity. Slow gait speed. LOB x 1 requiring assist from therapit to prevent fall. Pt fatigues fairly easily. Denied lightheadedness/dizziness   Stairs Stairs: Yes Min Assist Used 2 hands on 1 handrail (used L going up and R going down) Number of Stairs: 2 General stair comments: Up and over portable stairs x 1. VCs safety, technique, sequence. Small amount of assist to steady. Wife present to observe   Wheelchair Mobility    Modified Rankin (Stroke Patients Only)       Balance Overall balance assessment: Needs assistance         Standing balance support: Bilateral upper extremity supported Standing balance-Leahy Scale: Poor                              Cognition Arousal/Alertness: Awake/alert Behavior During Therapy: WFL for tasks assessed/performed Overall Cognitive Status: Within Functional Limits for tasks assessed                                        Exercises      General Comments        Pertinent Vitals/Pain Pain Assessment: 0-10 Pain Score: 4  Pain Location: L knee Pain Descriptors / Indicators: Discomfort;Sore;Aching Pain Intervention(s): Limited activity within patient's tolerance;Monitored during session;Ice applied;Repositioned    Home Living  Prior Function            PT Goals (current goals can now be found in the care plan section) Progress towards PT goals: Progressing toward goals    Frequency    7X/week      PT Plan Current plan remains appropriate    Co-evaluation              AM-PAC PT "6 Clicks" Mobility   Outcome Measure  Help needed turning from your back to your side while in a flat bed without using bedrails?: A Little Help needed moving from lying on your back to sitting on the side of a flat bed without using bedrails?: A Little Help needed moving to and from a bed to  a chair (including a wheelchair)?: A Little Help needed standing up from a chair using your arms (e.g., wheelchair or bedside chair)?: A Little Help needed to walk in hospital room?: A Little Help needed climbing 3-5 steps with a railing? : A Little 6 Click Score: 18    End of Session Equipment Utilized During Treatment: Gait belt Activity Tolerance: Patient tolerated treatment well;Patient limited by fatigue Patient left: in bed;with call bell/phone within reach;with family/visitor present;with bed alarm set   PT Visit Diagnosis: Pain;Other abnormalities of gait and mobility (R26.89) Pain - Right/Left: Left Pain - part of body: Knee     Time: 1345-1406 PT Time Calculation (min) (ACUTE ONLY): 21 min  Charges:  $Gait Training: 8-22 mins                        Doreatha Massed, PT Acute Rehabilitation  Office: [phone] Pager: [phone]

## 2020-09-07 NOTE — TOC Transition Note (Signed)
Transition of Care [facility]) - CM/SW Discharge Note   Patient Details  Name: Jacob Mayo MRN: [id number] Date of Birth: 1951-05-21  [facility] CM/SW Contact:  Clearance Coots, LCSW Phone Number: 09/07/2020, 11:11 AM   Clinical Narrative:    Therapy Plan: OPPT at [facility] RW and 3 in1 ordered through [employer]    Final next level of care: OP Rehab Barriers to Discharge: Barriers Resolved   Patient Goals and CMS Choice        Discharge Placement                       Discharge Plan and Services                DME Arranged: 3-N-1,Walker rolling DME Agency: [employer] Date DME Agency Contacted: 09/07/20 Time DME Agency Contacted: 1115 Representative spoke with at DME Agency: Harrold Donath            Social Determinants of Health (SDOH) Interventions     Readmission Risk Interventions No flowsheet data found.

## 2020-09-07 NOTE — Progress Notes (Signed)
   Subjective: 1 Day Post-Op Procedure(s) (LRB): TOTAL KNEE ARTHROPLASTY (Left) Patient reports pain as mild.   Patient seen in rounds by Dr. Lequita Halt. Patient is well, and has had no acute complaints or problems other than pain in the left knee. Denies chest pain, SOB, or calf pain. No issues overnight. Foley catheter removed this AM. We will continue therapy today, ambulated 40' yesterday.  Objective: Vital signs in last 24 hours: Temp:  [97.5 F (36.4 C)-98.7 F (37.1 C)] 98.3 F (36.8 C) (01/04 0610) Pulse Rate:  [40-86] 59 (01/04 0610) Resp:  [10-18] 16 (01/04 0610) BP: (114-148)/(65-83) 146/78 (01/04 0610) SpO2:  [93 %-100 %] 99 % (01/04 0610) Weight:  [86.2 kg] 86.2 kg (01/03 1331)  Intake/Output from previous day:  Intake/Output Summary (Last 24 hours) at 09/07/2020 0713 Last data filed at 09/07/2020 0615 Gross per 24 hour  Intake 3869.03 ml  Output 4200 ml  Net -330.97 ml     Intake/Output this shift: No intake/output data recorded.  Labs: Recent Labs    09/07/20 0322  HGB 12.2*   Recent Labs    09/07/20 0322  WBC 10.8*  RBC 3.78*  HCT 36.1*  PLT 208   Recent Labs    09/07/20 0322  NA 139  K 4.9  CL 109  CO2 22  BUN 24*  CREATININE 1.14  GLUCOSE 132*  CALCIUM 8.4*   No results for input(s): LABPT, INR in the last 72 hours.  Exam: General - Patient is Alert and Oriented Extremity - Neurologically intact Neurovascular intact Sensation intact distally Dorsiflexion/Plantar flexion intact Dressing - dressing C/D/I Motor Function - intact, moving foot and toes well on exam.   Past Medical History:  Diagnosis Date  . Arthritis   . Hypertension   . Sleep apnea    states this is no longer a problem since weight loss  . Wegener's granulomatosis (granulomatosis with polyangiitis) 1997   Open lung biopsy. Prednisone/Cytoxan    Assessment/Plan: 1 Day Post-Op Procedure(s) (LRB): TOTAL KNEE ARTHROPLASTY (Left) Principal Problem:   OA  (osteoarthritis) of knee Active Problems:   Primary osteoarthritis of knee  Estimated body mass index is 29.76 kg/m as calculated from the following:   Height as of this encounter: 5\' 7"  (1.702 m).   Weight as of this encounter: 86.2 kg. Advance diet Up with therapy D/C IV fluids   Patient's anticipated LOS is less than 2 midnights, meeting these requirements: - Lives within 1 hour of care - Has a competent adult at home to recover with post-op recover - NO history of  - Chronic pain requiring opiods  - Diabetes  - Coronary Artery Disease  - Heart failure  - Heart attack  - Stroke  - DVT/VTE  - Cardiac arrhythmia  - Respiratory Failure/COPD  - Renal failure  - Anemia  - Advanced Liver disease  DVT Prophylaxis - Aspirin Weight bearing as tolerated. Continue therapy.  Plan is to go Home after hospital stay. Plan for discharge after two sessions of physical therapy if meeting goals. Scheduled for outpatient physical therapy at [facility]. Follow-up in the office January 18th.  The PDMP database was reviewed today prior to any opioid medications being prescribed to this patient.  January 20, PA-C Orthopedic Surgery [phone] 09/07/2020, 7:13 AM

## 2020-09-07 NOTE — Progress Notes (Signed)
Physical Therapy Treatment Patient Details Name: Jacob Mayo MRN: [id number] DOB: October 30, 1950 Today's Date: 09/07/2020    History of Present Illness 70 yo male s/p L TKA 09/07/2019    PT Comments    Progressing well with mobility however, pt did become orthostatic during gait training this a.m. BP 64/54-made RN aware. Will plan to have a 2nd session to continue working towards PT goals.    Follow Up Recommendations  Follow surgeon's recommendation for DC plan and follow-up therapies;Supervision/Assistance - 24 hour     Equipment Recommendations  Rolling walker with 5" wheels;3in1 (PT)    Recommendations for Other Services       Precautions / Restrictions Precautions Precautions: Fall;Knee Knee Immobilizer - Left: Discontinue once straight leg raise with < 10 degree lag (able to SLR 1/4) Restrictions Weight Bearing Restrictions: No Other Position/Activity Restrictions: WBAT    Mobility  Bed Mobility Overal bed mobility: Needs Assistance Bed Mobility: Supine to Sit     Supine to sit: Supervision;HOB elevated     General bed mobility comments: supv for safety. increased time.  Transfers Overall transfer level: Needs assistance Equipment used: Rolling walker (2 wheeled) Transfers: Sit to/from Stand Sit to Stand: Min guard;From elevated surface         General transfer comment: Min guard for safety. VCs safety, technique, hand/LE placement.  Ambulation/Gait Ambulation/Gait assistance: Min assist Gait Distance (Feet): 55 Feet Assistive device: Rolling walker (2 wheeled) Gait Pattern/deviations: Step-to pattern     General Gait Details: VCs safety, sequence, technique, step length, RW proximity. Slow gait speed. Pt c/o dizziness that did not pass. Recliner brought out for him to sit. BP 64/56. Deferred further ambulation and transported pt back to the room in his recliner. RN made aware.   Stairs             Wheelchair Mobility    Modified Rankin  (Stroke Patients Only)       Balance Overall balance assessment: Needs assistance         Standing balance support: Bilateral upper extremity supported Standing balance-Leahy Scale: Poor                              Cognition Arousal/Alertness: Awake/alert Behavior During Therapy: WFL for tasks assessed/performed Overall Cognitive Status: Within Functional Limits for tasks assessed                                 General Comments: very minimal conversation      Exercises Total Joint Exercises Ankle Circles/Pumps: AROM;Both;10 reps;Supine Quad Sets: AROM;Both;10 reps;Supine Heel Slides: AAROM;Left;10 reps;Supine Hip ABduction/ADduction: AROM;Left;10 reps;Supine Straight Leg Raises: AROM;Left;10 reps;Supine Goniometric ROM: ~10-65 degrees    General Comments        Pertinent Vitals/Pain Pain Assessment: 0-10 Pain Score: 4  Pain Location: L knee Pain Descriptors / Indicators: Discomfort;Sore;Aching Pain Intervention(s): Limited activity within patient's tolerance;Monitored during session;Repositioned    Home Living                      Prior Function            PT Goals (current goals can now be found in the care plan section) Progress towards PT goals: Progressing toward goals    Frequency    7X/week      PT Plan Current plan remains appropriate    Co-evaluation  AM-PAC PT "6 Clicks" Mobility   Outcome Measure  Help needed turning from your back to your side while in a flat bed without using bedrails?: A Little Help needed moving from lying on your back to sitting on the side of a flat bed without using bedrails?: A Little Help needed moving to and from a bed to a chair (including a wheelchair)?: A Little Help needed standing up from a chair using your arms (e.g., wheelchair or bedside chair)?: A Little Help needed to walk in hospital room?: A Little Help needed climbing 3-5 steps with a  railing? : A Lot 6 Click Score: 17    End of Session Equipment Utilized During Treatment: Gait belt Activity Tolerance: Patient tolerated treatment well Patient left: in chair;with call bell/phone within reach;with family/visitor present   PT Visit Diagnosis: Pain;Other abnormalities of gait and mobility (R26.89) Pain - Right/Left: Left Pain - part of body: Knee     Time: 6578-4696 PT Time Calculation (min) (ACUTE ONLY): 34 min  Charges:  $Gait Training: 8-22 mins $Therapeutic Exercise: 8-22 mins                         Faye Ramsay, PT Acute Rehabilitation  Office: [phone] Pager: [phone]

## 2020-09-08 DIAGNOSIS — M1712 Unilateral primary osteoarthritis, left knee: Secondary | ICD-10-CM | POA: Diagnosis not present

## 2020-09-08 DIAGNOSIS — Z96652 Presence of left artificial knee joint: Secondary | ICD-10-CM | POA: Diagnosis not present

## 2020-09-08 DIAGNOSIS — I10 Essential (primary) hypertension: Secondary | ICD-10-CM | POA: Diagnosis not present

## 2020-09-08 DIAGNOSIS — Z79899 Other long term (current) drug therapy: Secondary | ICD-10-CM | POA: Diagnosis not present

## 2020-09-08 LAB — BASIC METABOLIC PANEL
Anion gap: 7 (ref 5–15)
BUN: 21 mg/dL (ref 8–23)
CO2: 26 mmol/L (ref 22–32)
Calcium: 8.3 mg/dL — ABNORMAL LOW (ref 8.9–10.3)
Chloride: 106 mmol/L (ref 98–111)
Creatinine, Ser: 1 mg/dL (ref 0.61–1.24)
GFR, Estimated: >60 mL/min (ref >60)
Glucose, Bld: 94 mg/dL (ref 70–99)
Potassium: 4.5 mmol/L (ref 3.5–5.1)
Sodium: 139 mmol/L (ref 135–145)

## 2020-09-08 LAB — CBC
HCT: 35.6 % — ABNORMAL LOW (ref 39.0–52.0)
Hemoglobin: 11.7 g/dL — ABNORMAL LOW (ref 13.0–17.0)
MCH: 32.1 pg (ref 26.0–34.0)
MCHC: 32.9 g/dL (ref 30.0–36.0)
MCV: 97.5 fL (ref 80.0–100.0)
Platelets: 188 10*3/uL (ref 150–400)
RBC: 3.65 MIL/uL — ABNORMAL LOW (ref 4.22–5.81)
RDW: 14.7 % (ref 11.5–15.5)
WBC: 11.6 10*3/uL — ABNORMAL HIGH (ref 4.0–10.5)
nRBC: 0 % (ref 0.0–0.2)

## 2020-09-08 MED ORDER — ACETAMINOPHEN 500 MG PO TABS
1000.0000 mg | ORAL_TABLET | Freq: Four times a day (QID) | ORAL | Status: DC | PRN
  Administered 2020-09-08: 1000 mg via ORAL
  Filled 2020-09-08: qty 2

## 2020-09-08 MED ORDER — RIVAROXABAN 10 MG PO TABS
10.0000 mg | ORAL_TABLET | Freq: Every day | ORAL | Status: DC
  Administered 2020-09-08: 10 mg via ORAL
  Filled 2020-09-08: qty 1

## 2020-09-08 MED ORDER — RIVAROXABAN 10 MG PO TABS: 10.0000 mg | ORAL_TABLET | Freq: Every day | ORAL | 0 refills | Status: DC

## 2020-09-08 NOTE — Progress Notes (Signed)
Physical Therapy Treatment Patient Details Name: Jacob Mayo MRN: [id number] DOB: 05/25/1951 Today's Date: 09/08/2020    History of Present Illness 70 yo male s/p L TKA 09/07/2019    PT Comments    Pt is POD # 2 and is progressing well.  He was able to ambulate, transfer, and perform stairs without episode of lightheadedness (pt had not had Robaxin since this am and feels that is what makes him lightheaded).  Pt demonstrates safe gait & transfers in order to return home from PT perspective once discharged by MD.  While in hospital, will continue to benefit from PT for skilled therapy to advance mobility and exercises.      Follow Up Recommendations  Follow surgeon's recommendation for DC plan and follow-up therapies;Supervision/Assistance - 24 hour     Equipment Recommendations  Rolling walker with 5" wheels;3in1 (PT)    Recommendations for Other Services       Precautions / Restrictions Precautions Precautions: Fall;Knee Precaution Comments: hypotension Required Braces or Orthoses: Knee Immobilizer - Left Knee Immobilizer - Left: Discontinue once straight leg raise with < 10 degree lag Restrictions Other Position/Activity Restrictions: WBAT    Mobility  Bed Mobility Overal bed mobility: Needs Assistance Bed Mobility: Supine to Sit;Sit to Supine     Supine to sit: Supervision Sit to supine: HOB elevated;Min assist   General bed mobility comments: Use of R LE to assist L leg off bed; wife provided assist for L leg back to bed with cues for hand placement  Transfers Overall transfer level: Needs assistance Equipment used: Rolling walker (2 wheeled) Transfers: Sit to/from Stand Sit to Stand: Min guard         General transfer comment: Min guard for safety; cues for safe hand placement, L LE management, and for controlled descent  Ambulation/Gait Ambulation/Gait assistance: Supervision;Min guard Gait Distance (Feet): 145 Feet Assistive device: Rolling walker (2  wheeled) Gait Pattern/deviations: Step-to pattern;Decreased stride length;Trunk flexed Gait velocity: decreased   General Gait Details: Min guard progressed to close supervision; min cues for posture   Stairs Stairs: Yes Stairs assistance: Min guard Stair Management: One rail Right;One rail Left Number of Stairs: 3 General stair comments: Up and over portable stairs x 1. VCs safety, technique, sequence. Used both hands on L rail up and R rail down. Min guard for safety. Wife present to observe and provided cues for sequence/rail use.  Went up 6" steps and down 4" steps   Wheelchair Mobility    Modified Rankin (Stroke Patients Only)       Balance Overall balance assessment: Needs assistance Sitting-balance support: No upper extremity supported Sitting balance-Leahy Scale: Normal     Standing balance support: Bilateral upper extremity supported;Single extremity supported Standing balance-Leahy Scale: Poor Standing balance comment: required RW but steady with RW                            Cognition Arousal/Alertness: Awake/alert Behavior During Therapy: WFL for tasks assessed/performed Overall Cognitive Status: Within Functional Limits for tasks assessed                                        Exercises Total Joint Exercises Ankle Circles/Pumps: AROM;Both;10 reps;Supine Quad Sets: AROM;Both;10 reps;Supine Towel Squeeze: AROM;10 reps;Both;Supine Heel Slides: AAROM;Left;10 reps;Supine (limited motion) Hip ABduction/ADduction: AAROM;Left;10 reps;Supine Long Arc Quad: AAROM;Left;5 reps;Seated (demonstrated how to hook  R LE to assist) Knee Flexion: AAROM;Left;10 reps;Seated Goniometric ROM: L knee ~10 degrees to 80 degrees    General Comments General comments (skin integrity, edema, etc.): BP 107/72 sitting and 97/72 post walk.  Denied lightheadedness.  Answered all questions and educated on safe car transfer technique      Pertinent  Vitals/Pain Pain Assessment: 0-10 Pain Score: 3  Pain Location: L knee Pain Descriptors / Indicators: Discomfort;Sore;Aching Pain Intervention(s): Limited activity within patient's tolerance;Monitored during session;Ice applied    Home Living                      Prior Function            PT Goals (current goals can now be found in the care plan section) Acute Rehab PT Goals Patient Stated Goal: regain PLOF. home. PT Goal Formulation: With patient/family Time For Goal Achievement: 09/20/20 Potential to Achieve Goals: Good Progress towards PT goals: Progressing toward goals    Frequency    7X/week      PT Plan Current plan remains appropriate    Co-evaluation              AM-PAC PT "6 Clicks" Mobility   Outcome Measure  Help needed turning from your back to your side while in a flat bed without using bedrails?: A Little Help needed moving from lying on your back to sitting on the side of a flat bed without using bedrails?: A Little Help needed moving to and from a bed to a chair (including a wheelchair)?: A Little Help needed standing up from a chair using your arms (e.g., wheelchair or bedside chair)?: A Little Help needed to walk in hospital room?: A Little Help needed climbing 3-5 steps with a railing? : A Little 6 Click Score: 18    End of Session Equipment Utilized During Treatment: Gait belt Activity Tolerance: Patient tolerated treatment well Patient left: with family/visitor present;in bed;with call bell/phone within reach Nurse Communication: Mobility status;Other (comment) (notified charge nurse pt "passed" PT) PT Visit Diagnosis: Pain;Other abnormalities of gait and mobility (R26.89) Pain - Right/Left: Left Pain - part of body: Knee     Time: 1696-7893 PT Time Calculation (min) (ACUTE ONLY): 30 min  Charges:  $Gait Training: 8-22 mins $Therapeutic Exercise: 8-22 mins                     Anise Salvo, PT Acute Rehab Services Pager  [phone] [facility] [phone]     Rayetta Humphrey 09/08/2020, 3:07 PM

## 2020-09-08 NOTE — Progress Notes (Signed)
   Subjective: 2 Days Post-Op Procedure(s) (LRB): TOTAL KNEE ARTHROPLASTY (Left) Patient reports pain as mild.   Patient seen in rounds for Dr. Lequita Halt. Patient is well, and has had no acute complaints or problems. States he rested well last night. Did not pass physical therapy yesterday, and had issues with hypotension during the first session. Held steady while ambulating during second session and is WNL this AM. Denies chest pain, SOB, or calf pain. No issues overnight. Voiding without difficulty. Plan is to go Home after hospital stay.  Objective: Vital signs in last 24 hours: Temp:  [97.6 F (36.4 C)-98.6 F (37 C)] 97.8 F (36.6 C) (01/05 0552) Pulse Rate:  [62-70] 62 (01/05 0552) Resp:  [16-18] 16 (01/05 0552) BP: (118-149)/(77-89) 149/89 (01/05 0552) SpO2:  [93 %-98 %] 96 % (01/05 0552)  Intake/Output from previous day:  Intake/Output Summary (Last 24 hours) at 09/08/2020 0841 Last data filed at 09/08/2020 0600 Gross per 24 hour  Intake 1365.74 ml  Output 900 ml  Net 465.74 ml    Intake/Output this shift: No intake/output data recorded.  Labs: Recent Labs    09/07/20 0322 09/08/20 0441  HGB 12.2* 11.7*   Recent Labs    09/07/20 0322 09/08/20 0441  WBC 10.8* 11.6*  RBC 3.78* 3.65*  HCT 36.1* 35.6*  PLT 208 188   Recent Labs    09/07/20 0322 09/08/20 0441  NA 139 139  K 4.9 4.5  CL 109 106  CO2 22 26  BUN 24* 21  CREATININE 1.14 1.00  GLUCOSE 132* 94  CALCIUM 8.4* 8.3*   No results for input(s): LABPT, INR in the last 72 hours.  Exam: General - Patient is Alert and Oriented Extremity - Neurologically intact Neurovascular intact Sensation intact distally Dorsiflexion/Plantar flexion intact Dressing/Incision - clean, dry, no drainage. Aquacel in place. Motor Function - intact, moving foot and toes well on exam.   Past Medical History:  Diagnosis Date  . Arthritis   . Hypertension   . Sleep apnea    states this is no longer a problem since  weight loss  . Wegener's granulomatosis (granulomatosis with polyangiitis) 1997   Open lung biopsy. Prednisone/Cytoxan    Assessment/Plan: 2 Days Post-Op Procedure(s) (LRB): TOTAL KNEE ARTHROPLASTY (Left) Principal Problem:   OA (osteoarthritis) of knee Active Problems:   Primary osteoarthritis of knee  Estimated body mass index is 29.76 kg/m as calculated from the following:   Height as of this encounter: 5\' 7"  (1.702 m).   Weight as of this encounter: 86.2 kg. Up with therapy  DVT Prophylaxis - Xarelto Weight-bearing as tolerated  Switched DVT prophylaxis from ASA to Xarelto due to history of gastric bypass. Sending in Xarelto script this AM for wife to pickup.   Plan for discharge to home after one session of physical therapy if cleared. Scheduled to begin OPPT tomorrow. Follow-up in the office January 18th.  January 20, PA-C Orthopedic Surgery [phone] 09/08/2020, 8:41 AM

## 2020-09-08 NOTE — Progress Notes (Signed)
Physical Therapy Treatment Patient Details Name: Jacob Mayo MRN: [id number] DOB: 02/27/1951 Today's Date: 09/08/2020    History of Present Illness 70 yo male s/p L TKA 09/07/2019    PT Comments    Pt had further issues with hypotension during session, suspect medication related.  Nursing aware.  He was able to ambulate and performed steps with assist, but became lightheaded afterwards and required chair to sit.  Will continue to benefit from PT prior to d/c.     Follow Up Recommendations  Follow surgeon's recommendation for DC plan and follow-up therapies;Supervision/Assistance - 24 hour     Equipment Recommendations  Rolling walker with 5" wheels;3in1 (PT)    Recommendations for Other Services       Precautions / Restrictions Precautions Precautions: Fall;Knee Precaution Comments: hypotension Required Braces or Orthoses: Knee Immobilizer - Left Knee Immobilizer - Left: Discontinue once straight leg raise with < 10 degree lag Restrictions Other Position/Activity Restrictions: WBAT    Mobility  Bed Mobility Overal bed mobility: Needs Assistance Bed Mobility: Supine to Sit     Supine to sit: Supervision     General bed mobility comments: HOB elevated and cues/education for use of gait belt to assist L foot  Transfers Overall transfer level: Needs assistance Equipment used: Rolling walker (2 wheeled) Transfers: Sit to/from Stand Sit to Stand: Min guard         General transfer comment: Min guard for safety; cues for safe hand placement, L LE management, and for controlled descent  Ambulation/Gait Ambulation/Gait assistance: Min guard Gait Distance (Feet): 125 Feet Assistive device: Rolling walker (2 wheeled) Gait Pattern/deviations: Step-to pattern;Decreased stride length;Trunk flexed Gait velocity: decreased   General Gait Details: Min cues for posture and min guard for safety due to hx of orthostatic hypotension; Slow but steady gait; no LOB today; wife  assisted with chair follow   Stairs Stairs: Yes Stairs assistance: Min guard   Number of Stairs: 3 General stair comments: Up and over portable stairs x 1. VCs safety, technique, sequence. Used both hands on L rail up and R rail down. Min guard for safety. Wife present to observe and provided cues for sequence/rail use.  Pt began feeling lighthead and unable to try stairs again.   Wheelchair Mobility    Modified Rankin (Stroke Patients Only)       Balance Overall balance assessment: Needs assistance Sitting-balance support: No upper extremity supported Sitting balance-Leahy Scale: Normal     Standing balance support: Bilateral upper extremity supported Standing balance-Leahy Scale: Poor Standing balance comment: required RW but steady with RW                            Cognition Arousal/Alertness: Awake/alert Behavior During Therapy: WFL for tasks assessed/performed Overall Cognitive Status: Within Functional Limits for tasks assessed                                        Exercises Total Joint Exercises Cues provided for correct form , AAROM techniques, and limited reps/ROM as needed for pain control Ankle Circles/Pumps: AROM;Both;10 reps;Supine Quad Sets: AROM;Both;10 reps;Supine Towel Squeeze: AROM;10 reps;Both;Supine Heel Slides: AAROM;Left;10 reps;Supine (limited motion) Hip ABduction/ADduction: AAROM;Left;10 reps;Supine Long Arc Quad: AAROM;Left;5 reps;Seated (demonstrated how to hook R LE to assist) Knee Flexion: AAROM;Left;10 reps;Seated Goniometric ROM: L knee ~10 degrees to 80 degrees    General Comments  General comments (skin integrity, edema, etc.): Pt's BP sitting EOB was 120/67.  Pt ambuated and performed stairs x 1 then c/o lightheadedness, so returned to seated position.  BP at that time 83/58.  Pt feeling better once reclined.  Wife reports pt received muscle relaxer again this morning and that lightheadedness onset  afterwards.      Pertinent Vitals/Pain Pain Assessment: 0-10 Pain Score: 3  Pain Location: L knee Pain Descriptors / Indicators: Discomfort;Sore;Aching Pain Intervention(s): Limited activity within patient's tolerance;Monitored during session;Premedicated before session    Home Living                      Prior Function            PT Goals (current goals can now be found in the care plan section) Acute Rehab PT Goals Patient Stated Goal: regain PLOF. home. PT Goal Formulation: With patient/family Time For Goal Achievement: 09/20/20 Potential to Achieve Goals: Good Progress towards PT goals: Progressing toward goals    Frequency    7X/week      PT Plan Current plan remains appropriate    Co-evaluation              AM-PAC PT "6 Clicks" Mobility   Outcome Measure  Help needed turning from your back to your side while in a flat bed without using bedrails?: A Little Help needed moving from lying on your back to sitting on the side of a flat bed without using bedrails?: A Little Help needed moving to and from a bed to a chair (including a wheelchair)?: A Little Help needed standing up from a chair using your arms (e.g., wheelchair or bedside chair)?: A Little Help needed to walk in hospital room?: A Little Help needed climbing 3-5 steps with a railing? : A Little 6 Click Score: 18    End of Session Equipment Utilized During Treatment: Gait belt Activity Tolerance: Other (comment) (limited by hypotension/lightheadedness) Patient left: with call bell/phone within reach;with family/visitor present;in chair;with chair alarm set Nurse Communication: Mobility status;Other (comment) (BP drop, needs further PT) PT Visit Diagnosis: Pain;Other abnormalities of gait and mobility (R26.89) Pain - Right/Left: Left Pain - part of body: Knee     Time: 9937-1696 PT Time Calculation (min) (ACUTE ONLY): 38 min  Charges:  $Gait Training: 8-22 mins $Therapeutic  Exercise: 8-22 mins $Therapeutic Activity: 8-22 mins                     Anise Salvo, PT Acute Rehab Services Pager [phone] [facility] [phone]     Rayetta Humphrey 09/08/2020, 11:30 AM

## 2020-09-09 NOTE — Discharge Summary (Signed)
Physician Discharge Summary   Patient ID: Jacob Mayo MRN: [id number] DOB/AGE: 02-03-1951 70 y.o.  Admit date: 09/06/2020 Discharge date: 09/08/2020  Primary Diagnosis: Osteoarthritis, left knee   Admission Diagnoses:  Past Medical History:  Diagnosis Date  . Arthritis   . Hypertension   . Sleep apnea    states this is no longer a problem since weight loss  . Wegener's granulomatosis (granulomatosis with polyangiitis) 1997   Open lung biopsy. Prednisone/Cytoxan   Discharge Diagnoses:   Principal Problem:   OA (osteoarthritis) of knee Active Problems:   Primary osteoarthritis of knee  Estimated body mass index is 29.76 kg/m as calculated from the following:   Height as of this encounter: 5\' 7"  (1.702 m).   Weight as of this encounter: 86.2 kg.  Procedure:  Procedure(s) (LRB): TOTAL KNEE ARTHROPLASTY (Left)   Consults: None  HPI: Jacob Mayo is a 70 y.o. year old male with end stage OA of his left knee with progressively worsening pain and dysfunction. He has constant pain, with activity and at rest and significant functional deficits with difficulties even with ADLs. He has had extensive non-op management including analgesics, injections of cortisone, and home exercise program, but remains in significant pain with significant dysfunction. Radiographs show bone on bone arthritis lateral and patellofemoral. He presents now for left Total Knee Arthroplasty.    Laboratory Data: Admission on 09/06/2020, Discharged on 09/08/2020  Component Date Value Ref Range Status  . WBC 09/07/2020 10.8* 4.0 - 10.5 K/uL Final  . RBC 09/07/2020 3.78* 4.22 - 5.81 MIL/uL Final  . Hemoglobin 09/07/2020 12.2* 13.0 - 17.0 g/dL Final  . HCT 09/07/2020 36.1* 39.0 - 52.0 % Final  . MCV 09/07/2020 95.5  80.0 - 100.0 fL Final  . MCH 09/07/2020 32.3  26.0 - 34.0 pg Final  . MCHC 09/07/2020 33.8  30.0 - 36.0 g/dL Final  . RDW 09/07/2020 14.5  11.5 - 15.5 % Final  . Platelets 09/07/2020 208  150 -  400 K/uL Final  . nRBC 09/07/2020 0.0  0.0 - 0.2 % Final   Performed at [facility], [place] [address] [place], [place] [address]  . Sodium 09/07/2020 139  135 - 145 mmol/L Final  . Potassium 09/07/2020 4.9  3.5 - 5.1 mmol/L Final  . Chloride 09/07/2020 109  98 - 111 mmol/L Final  . CO2 09/07/2020 22  22 - 32 mmol/L Final  . Glucose, Bld 09/07/2020 132* 70 - 99 mg/dL Final   Glucose reference range applies only to samples taken after fasting for at least 8 hours.  . BUN 09/07/2020 24* 8 - 23 mg/dL Final  . Creatinine, Ser 09/07/2020 1.14  0.61 - 1.24 mg/dL Final  . Calcium 09/07/2020 8.4* 8.9 - 10.3 mg/dL Final  . GFR, Estimated 09/07/2020 >60  >60 mL/min Final   Comment: (NOTE) Calculated using the CKD-EPI Creatinine Equation (2021)   . Anion gap 09/07/2020 8  5 - 15 Final   Performed at [facility], [place] [address], [place], [place] [address]  . WBC 09/08/2020 11.6* 4.0 - 10.5 K/uL Final  . RBC 09/08/2020 3.65* 4.22 - 5.81 MIL/uL Final  . Hemoglobin 09/08/2020 11.7* 13.0 - 17.0 g/dL Final  . HCT 09/08/2020 35.6* 39.0 - 52.0 % Final  . MCV 09/08/2020 97.5  80.0 - 100.0 fL Final  . MCH 09/08/2020 32.1  26.0 - 34.0 pg Final  . MCHC 09/08/2020 32.9  30.0 - 36.0 g/dL Final  . RDW 09/08/2020  14.7  11.5 - 15.5 % Final  . Platelets 09/08/2020 188  150 - 400 K/uL Final  . nRBC 09/08/2020 0.0  0.0 - 0.2 % Final   Performed at [facility], [address], [place], [place] [address]  . Sodium 09/08/2020 139  135 - 145 mmol/L Final  . Potassium 09/08/2020 4.5  3.5 - 5.1 mmol/L Final  . Chloride 09/08/2020 106  98 - 111 mmol/L Final  . CO2 09/08/2020 26  22 - 32 mmol/L Final  . Glucose, Bld 09/08/2020 94  70 - 99 mg/dL Final   Glucose reference range applies only to samples taken after fasting for at least 8 hours.  . BUN 09/08/2020 21  8 - 23 mg/dL Final  . Creatinine, Ser 09/08/2020 1.00  0.61 - 1.24 mg/dL Final  . Calcium 09/98/3382  8.3* 8.9 - 10.3 mg/dL Final  . GFR, Estimated 09/08/2020 >60  >60 mL/min Final   Comment: (NOTE) Calculated using the CKD-EPI Creatinine Equation (2021)   . Anion gap 09/08/2020 7  5 - 15 Final   Performed at [facility], [address], [place], [place] [address]  Hospital Outpatient Visit on 09/02/2020  Component Date Value Ref Range Status  . SARS Coronavirus 2 09/02/2020 NEGATIVE  NEGATIVE Final   Comment: (NOTE) SARS-CoV-2 target nucleic acids are NOT DETECTED.  The SARS-CoV-2 RNA is generally detectable in upper and lower respiratory specimens during the acute phase of infection. Negative results do not preclude SARS-CoV-2 infection, do not rule out co-infections with other pathogens, and should not be used as the sole basis for treatment or other patient management decisions. Negative results must be combined with clinical observations, patient history, and epidemiological information. The expected result is Negative.  Fact Sheet for Patients: HairSlick.no  Fact Sheet for Healthcare Providers: quierodirigir.com  This test is not yet approved or cleared by the [place] FDA and  has been authorized for detection and/or diagnosis of SARS-CoV-2 by FDA under an Emergency Use Authorization (EUA). This EUA will remain  in effect (meaning this test can be used) for the duration of the COVID-19 declaration under Se                          ction 564(b)(1) of the Act, 21 U.S.C. section 360bbb-3(b)(1), unless the authorization is terminated or revoked sooner.  Performed at [facility], [address] [place], [place] [address]   Hospital Outpatient Visit on 08/25/2020  Component Date Value Ref Range Status  . WBC 08/25/2020 6.0  4.0 - 10.5 K/uL Final  . RBC 08/25/2020 4.31  4.22 - 5.81 MIL/uL Final  . Hemoglobin 08/25/2020 13.8  13.0 - 17.0 g/dL Final  . HCT 19/37/9024 42.2  39.0 - 52.0 %  Final  . MCV 08/25/2020 97.9  80.0 - 100.0 fL Final  . MCH 08/25/2020 32.0  26.0 - 34.0 pg Final  . MCHC 08/25/2020 32.7  30.0 - 36.0 g/dL Final  . RDW 09/73/5329 14.4  11.5 - 15.5 % Final  . Platelets 08/25/2020 202  150 - 400 K/uL Final  . nRBC 08/25/2020 0.0  0.0 - 0.2 % Final   Performed at [facility], [address] [place], [place] [address]  . Sodium 08/25/2020 139  135 - 145 mmol/L Final  . Potassium 08/25/2020 4.5  3.5 - 5.1 mmol/L Final  . Chloride 08/25/2020 106  98 - 111 mmol/L Final  .  CO2 08/25/2020 24  22 - 32 mmol/L Final  . Glucose, Bld 08/25/2020 87  70 - 99 mg/dL Final   Glucose reference range applies only to samples taken after fasting for at least 8 hours.  . BUN 08/25/2020 29* 8 - 23 mg/dL Final  . Creatinine, Ser 08/25/2020 1.26* 0.61 - 1.24 mg/dL Final  . Calcium 02/28/9484 9.1  8.9 - 10.3 mg/dL Final  . Total Protein 08/25/2020 7.2  6.5 - 8.1 g/dL Final  . Albumin 46/27/0350 4.3  3.5 - 5.0 g/dL Final  . AST 09/38/1829 41  15 - 41 U/L Final  . ALT 08/25/2020 33  0 - 44 U/L Final  . Alkaline Phosphatase 08/25/2020 96  38 - 126 U/L Final  . Total Bilirubin 08/25/2020 0.7  0.3 - 1.2 mg/dL Final  . GFR, Estimated 08/25/2020 >60  >60 mL/min Final   Comment: (NOTE) Calculated using the CKD-EPI Creatinine Equation (2021)   . Anion gap 08/25/2020 9  5 - 15 Final   Performed at [facility], [address], [place], [place] [address]  . Prothrombin Time 08/25/2020 13.9  11.4 - 15.2 seconds Final  . INR 08/25/2020 1.1  0.8 - 1.2 Final   Comment: (NOTE) INR goal varies based on device and disease states. Performed at [facility], [address], [place], [place] [address]   . aPTT 08/25/2020 29  24 - 36 seconds Final   Performed at [facility], [address] [place], [place] [address]  . ABO/RH(D) 08/25/2020 B POS   Final  . Antibody Screen 08/25/2020 NEG   Final  . Sample  Expiration 08/25/2020 09/08/2020,2359   Final  . Extend sample reason 08/25/2020    Final                   Value:NO TRANSFUSIONS OR PREGNANCY IN THE PAST 3 MONTHS Performed at [facility], [address] [place], [place] [address]   . MRSA, PCR 08/25/2020 NEGATIVE  NEGATIVE Final  . Staphylococcus aureus 08/25/2020 NEGATIVE  NEGATIVE Final   Comment: (NOTE) The Xpert SA Assay (FDA approved for NASAL specimens in patients 16 years of age and older), is one component of a comprehensive surveillance program. It is not intended to diagnose infection nor to guide or monitor treatment. Performed at [facility], [address] [place], [place] [address]      X-Rays:No results found.  EKG: Orders placed or performed during the hospital encounter of 08/25/20  . EKG 12-Lead  . EKG 12-Lead     Hospital Course: Jacob Mayo is a 70 y.o. who was admitted to [facility]. They were brought to the operating room on 09/06/2020 and underwent Procedure(s): TOTAL KNEE ARTHROPLASTY.  Patient tolerated the procedure well and was later transferred to the recovery room and then to the orthopaedic floor for postoperative care. They were given PO and IV analgesics for pain control following their surgery. They were given 24 hours of postoperative antibiotics of  Anti-infectives (From admission, onward)   Start     Dose/Rate Route Frequency Ordered Stop   09/06/20 1630  ceFAZolin (ANCEF) IVPB 2g/100 mL premix        2 g 200 mL/hr over 30 Minutes Intravenous Every 6 hours 09/06/20 1327 09/06/20 2342   09/06/20 0730  ceFAZolin (ANCEF) IVPB 2g/100 mL premix        2 g 200 mL/hr over 30 Minutes Intravenous On call  to O.R. 09/06/20 4967 09/06/20 1035     and started on DVT prophylaxis in the form of Xarelto.   PT and OT were ordered for total joint protocol. Discharge planning consulted to help with postop disposition and equipment needs. Patient had a good  night on the evening of surgery. They started to get up OOB with therapy on POD #0. Continued to work with therapy into POD #2. Pt was seen during rounds on day two and was ready to go home pending progress with therapy. Dressing was changed and the incision was clean, dry, and intact with Aquacel in place. Pt worked with therapy for two additional sessions and was meeting their goals. He was discharged to home later that day in stable condition.  Diet: Regular diet Activity: WBAT Follow-up: in 2 weeks Disposition: Home with outpatient physical therapy Discharged Condition: stable   Discharge Instructions    Call MD / Call 911   Complete by: As directed    If you experience chest pain or shortness of breath, CALL 911 and be transported to the hospital emergency room.  If you develope a fever above 101 F, pus (white drainage) or increased drainage or redness at the wound, or calf pain, call your surgeon's office.   Change dressing   Complete by: As directed    You may remove the bulky bandage (ACE wrap and gauze) two days after surgery. You will have an adhesive waterproof bandage underneath. Leave this in place until your first follow-up appointment.   Constipation Prevention   Complete by: As directed    Drink plenty of fluids.  Prune juice may be helpful.  You may use a stool softener, such as Colace (over the counter) 100 mg twice a day.  Use MiraLax (over the counter) for constipation as needed.   Diet - low sodium heart healthy   Complete by: As directed    Do not put a pillow under the knee. Place it under the heel.   Complete by: As directed    Driving restrictions   Complete by: As directed    No driving for two weeks   TED hose   Complete by: As directed    Use stockings (TED hose) for three weeks on both leg(s).  You may remove them at night for sleeping.   Weight bearing as tolerated   Complete by: As directed      Allergies as of 09/08/2020   No Known Allergies      Medication List    TAKE these medications   alendronate 70 MG tablet Commonly known as: FOSAMAX Take 70 mg by mouth every Wednesday. Take with a full glass of water on an empty stomach.   allopurinol 100 MG tablet Commonly known as: ZYLOPRIM Take 200 mg by mouth daily.   diphenhydramine-acetaminophen 25-500 MG Tabs tablet Commonly known as: TYLENOL PM Take 1 tablet by mouth at bedtime as needed (pain).   doxazosin 8 MG tablet Commonly known as: CARDURA Take 8 mg by mouth At bedtime.   fish oil-omega-3 fatty acids 1000 MG capsule Take 2 g by mouth daily.   gabapentin 300 MG capsule Commonly known as: NEURONTIN Take a 300 mg capsule three times a day for two weeks following surgery.Then take a 300 mg capsule two times a day for two weeks. Then take a 300 mg capsule once a day for two weeks. Then discontinue.   losartan 25 MG tablet Commonly known as: COZAAR Take 25 mg by mouth daily.  methocarbamol 500 MG tablet Commonly known as: ROBAXIN Take 1 tablet (500 mg total) by mouth every 6 (six) hours as needed for muscle spasms.   methotrexate 1 g injection Commonly known as: 50 mg/ml Inject into the vein every Saturday. 0.6 mL   multivitamin tablet Take 2 tablets by mouth daily.   oxyCODONE 5 MG immediate release tablet Commonly known as: Oxy IR/ROXICODONE Take 1-2 tablets (5-10 mg total) by mouth every 6 (six) hours as needed for severe pain.   potassium chloride 10 MEQ tablet Commonly known as: KLOR-CON Take 20 mEq by mouth 2 (two) times daily.   rivaroxaban 10 MG Tabs tablet Commonly known as: XARELTO Take 1 tablet (10 mg total) by mouth daily for 19 days.   rosuvastatin 5 MG tablet Commonly known as: CRESTOR Take 5 mg by mouth daily.   sulfamethoxazole-trimethoprim 400-80 MG tablet Commonly known as: BACTRIM Take 1 tablet by mouth 2 (two) times daily.   traMADol 50 MG tablet Commonly known as: ULTRAM Take 1-2 tablets (50-100 mg total) by mouth every 6  (six) hours as needed for moderate pain.   vitamin B-12 1000 MCG tablet Commonly known as: CYANOCOBALAMIN Take 1,000 mcg by mouth daily.            Discharge Care Instructions  (From admission, onward)         Start     Ordered   09/07/20 0000  Weight bearing as tolerated        09/07/20 0717   09/07/20 0000  Change dressing       Comments: You may remove the bulky bandage (ACE wrap and gauze) two days after surgery. You will have an adhesive waterproof bandage underneath. Leave this in place until your first follow-up appointment.   09/07/20 1610          Follow-up Information    Ollen Gross, MD. Schedule an appointment as soon as possible for a visit on 09/21/2020.   Specialty: Orthopedic Surgery Contact information: [address] [place] [address] [place] [address] [phone]               Signed: Arther Abbott, PA-C Orthopedic Surgery 09/09/2020, 6:55 AM

## 2020-09-14 DIAGNOSIS — M25562 Pain in left knee: Secondary | ICD-10-CM | POA: Diagnosis not present

## 2020-09-16 DIAGNOSIS — M25562 Pain in left knee: Secondary | ICD-10-CM | POA: Diagnosis not present

## 2020-09-22 DIAGNOSIS — M25562 Pain in left knee: Secondary | ICD-10-CM | POA: Diagnosis not present

## 2020-09-27 DIAGNOSIS — M25562 Pain in left knee: Secondary | ICD-10-CM | POA: Diagnosis not present

## 2020-09-29 DIAGNOSIS — M25562 Pain in left knee: Secondary | ICD-10-CM | POA: Diagnosis not present

## 2020-10-01 DIAGNOSIS — M25562 Pain in left knee: Secondary | ICD-10-CM | POA: Diagnosis not present

## 2020-10-04 DIAGNOSIS — M25562 Pain in left knee: Secondary | ICD-10-CM | POA: Diagnosis not present

## 2020-10-04 DIAGNOSIS — Z79899 Other long term (current) drug therapy: Secondary | ICD-10-CM | POA: Diagnosis not present

## 2020-10-04 DIAGNOSIS — E782 Mixed hyperlipidemia: Secondary | ICD-10-CM | POA: Diagnosis not present

## 2020-10-04 DIAGNOSIS — N1831 Chronic kidney disease, stage 3a: Secondary | ICD-10-CM | POA: Diagnosis not present

## 2020-10-04 DIAGNOSIS — R946 Abnormal results of thyroid function studies: Secondary | ICD-10-CM | POA: Diagnosis not present

## 2020-10-05 DIAGNOSIS — M0609 Rheumatoid arthritis without rheumatoid factor, multiple sites: Secondary | ICD-10-CM | POA: Diagnosis not present

## 2020-10-07 DIAGNOSIS — M25562 Pain in left knee: Secondary | ICD-10-CM | POA: Diagnosis not present

## 2020-10-08 ENCOUNTER — Telehealth: Payer: Medicare PPO | Admitting: Cardiology

## 2020-10-12 DIAGNOSIS — Z96652 Presence of left artificial knee joint: Secondary | ICD-10-CM | POA: Diagnosis not present

## 2020-10-13 ENCOUNTER — Telehealth (INDEPENDENT_AMBULATORY_CARE_PROVIDER_SITE_OTHER): Payer: Medicare PPO | Admitting: Cardiology

## 2020-10-13 ENCOUNTER — Telehealth: Payer: Medicare PPO | Admitting: Cardiology

## 2020-10-13 ENCOUNTER — Encounter: Payer: Self-pay | Admitting: Cardiology

## 2020-10-13 VITALS — BP 137/84 | HR 85 | Ht 67.0 in | Wt 189.0 lb

## 2020-10-13 DIAGNOSIS — I10 Essential (primary) hypertension: Secondary | ICD-10-CM

## 2020-10-13 DIAGNOSIS — I951 Orthostatic hypotension: Secondary | ICD-10-CM | POA: Diagnosis not present

## 2020-10-13 DIAGNOSIS — R42 Dizziness and giddiness: Secondary | ICD-10-CM

## 2020-10-13 DIAGNOSIS — H43821 Vitreomacular adhesion, right eye: Secondary | ICD-10-CM | POA: Diagnosis not present

## 2020-10-13 DIAGNOSIS — H35372 Puckering of macula, left eye: Secondary | ICD-10-CM | POA: Diagnosis not present

## 2020-10-13 NOTE — Addendum Note (Signed)
Addended by: Theresia Majors on: 10/13/2020 03:01 PM   Modules accepted: Orders

## 2020-10-13 NOTE — Patient Instructions (Signed)
Medication Instructions:  Your physician has recommended you make the following change in your medication:  1) STOP taking Cardura after speaking with your urologist     >Once you have stopped Cardura please take your blood pressure daily for one week and call us with the readings 2) Dr. Mayford Knife recommends that you discontinue taking narcotics and gabapentin on Tuesday (2/15)  *If you need a refill on your cardiac medications before your next appointment, please call your pharmacy*   Testing/Procedures: Your physician has requested that you have an echocardiogram. Echocardiography is a painless test that uses sound waves to create images of your heart. It provides your doctor with information about the size and shape of your heart and how well your heart's chambers and valves are working. This procedure takes approximately one hour. There are no restrictions for this procedure.  Follow-Up: At [facility], you and your health needs are our priority.  As part of our continuing mission to provide you with exceptional heart care, we have created designated Provider Care Teams.  These Care Teams include your primary Cardiologist (physician) and Advanced Practice Providers (APPs -  Physician Assistants and Nurse Practitioners) who all work together to provide you with the care you need, when you need it.  Your next appointment:   3 month(s)  The format for your next appointment:   In Person  Provider:   You may see Armanda Magic, MD or one of the following Advanced Practice Providers on your designated Care Team:    Ronie Spies, PA-C  Jacolyn Reedy, PA-C

## 2020-10-13 NOTE — Progress Notes (Signed)
Virtual Cardiology Consult Visit via Video Note   This visit type was conducted due to national recommendations for restrictions regarding the COVID-19 Pandemic (e.g. social distancing) in an effort to limit this patient's exposure and mitigate transmission in our community.  Due to his co-morbid illnesses, this patient is at least at moderate risk for complications without adequate follow up.  This format is felt to be most appropriate for this patient at this time.  All issues noted in this document were discussed and addressed.  A limited physical exam was performed with this format.  Please refer to the patient's chart for his consent to telehealth for [facility].  Date:  10/13/2020   ID:  [name], DOB 28-Jan-1951, MRN [id number]  Patient Location: Home Provider Location: Home Office  PCP:  Catha Gosselin, MD  Cardiologist:  NEW Electrophysiologist:  None   Evaluation Performed:  New Patient Evaluation  Chief Complaint:  Orthostatic Hypotension and syncope  History of Present Illness:    Jacob Mayo is a 70 y.o. male with a hx of OSA not on CPAP and HTN.Marland Kitchen He is referred by Dr. Clarene Duke for evaluation of dizziness and orthostatic hypotension.     Herecently had a TKR on 09/26/2020.  He did well with surgery but the first post op day his BP started to decrease.  He had problems with dizziness and was taking narcotics and Robaxin and his BP was noted to drop to 83/74mmHg when ambulating.   Prior to knee surgery he was on doxazosin 8mg  qhs and Losartan 25mg  daily and was discharged on these meds.   He started outpt PT and he started having problems with low BPs to the point he could not participate in PT. When BP started to drop during PT he was instructed to stop the losartan and doxazosin but then BP went up in PT and he restarted the Doxazosin.  Since then he has been doing well.    His wife tells me that he has had problems with his BP dropping into the 70's systolic in the past  and then will go high.  Unfortunately he is on Cardura for his prostate which he has been on for years.  He denies any chest pain or pressure, SOB, DOE, PND, orthopnea, LE edema, dizziness, palpitations or syncope. He is compliant with his meds and is tolerating meds with no SE.    The patient does not have symptoms concerning for COVID-19 infection (fever, chills, cough, or new shortness of breath).    Past Medical History:  Diagnosis Date  . Arthritis   . Hypertension   . Sleep apnea    states this is no longer a problem since weight loss  . Wegener's granulomatosis (granulomatosis with polyangiitis) 1997   Open lung biopsy. Prednisone/Cytoxan   Past Surgical History:  Procedure Laterality Date  . APPENDECTOMY    . EYE SURGERY    . GASTRIC BYPASS  2006  . HERNIA REPAIR    . KNEE SURGERY    . LUNG BIOPSY  1997  . TOTAL KNEE ARTHROPLASTY Left 09/06/2020   Procedure: TOTAL KNEE ARTHROPLASTY;  Surgeon: 2007, MD;  Location: [facility];  Service: Orthopedics;  Laterality: Left;  11/04/2020     Current Meds  Medication Sig  . abatacept (ORENCIA) 250 MG injection Inject into the vein every 30 (thirty) days.  Ollen Gross alendronate (FOSAMAX) 70 MG tablet Take 70 mg by mouth every Wednesday. Take with a full glass of water on  an empty stomach.  Marland Kitchen allopurinol (ZYLOPRIM) 100 MG tablet Take 200 mg by mouth daily.  . diphenhydramine-acetaminophen (TYLENOL PM) 25-500 MG TABS Take 1 tablet by mouth at bedtime as needed (pain).  Marland Kitchen docusate sodium (COLACE) 100 MG capsule Take 100 mg by mouth 2 (two) times daily.  Marland Kitchen doxazosin (CARDURA) 8 MG tablet Take 8 mg by mouth At bedtime.  . fish oil-omega-3 fatty acids 1000 MG capsule Take 2 g by mouth daily.   Marland Kitchen gabapentin (NEURONTIN) 300 MG capsule Take a 300 mg capsule three times a day for two weeks following surgery.Then take a 300 mg capsule two times a day for two weeks. Then take a 300 mg capsule once a day for two weeks. Then discontinue.  . methotrexate (50  MG/ML) 1 g injection Inject into the vein every Saturday. 0.6 mL  . Multiple Vitamin (MULTIVITAMIN) tablet Take 2 tablets by mouth daily.  Marland Kitchen oxyCODONE (OXY IR/ROXICODONE) 5 MG immediate release tablet Take 1-2 tablets (5-10 mg total) by mouth every 6 (six) hours as needed for severe pain.  . potassium chloride (K-DUR) 10 MEQ tablet Take 20 mEq by mouth 2 (two) times daily.  . rosuvastatin (CRESTOR) 5 MG tablet Take 5 mg by mouth daily.  Marland Kitchen sulfamethoxazole-trimethoprim (BACTRIM) 400-80 MG tablet Take 1 tablet by mouth 2 (two) times daily.  . traMADol (ULTRAM) 50 MG tablet Take 1-2 tablets (50-100 mg total) by mouth every 6 (six) hours as needed for moderate pain.  . vitamin B-12 (CYANOCOBALAMIN) 1000 MCG tablet Take 1,000 mcg by mouth daily.     Allergies:   Patient has no known allergies.   Social History   Tobacco Use  . Smoking status: Never Smoker  . Smokeless tobacco: Never Used  Vaping Use  . Vaping Use: Never used  Substance Use Topics  . Alcohol use: Yes    Alcohol/week: 2.0 standard drinks    Types: 2 Glasses of wine per week    Comment: occas.  . Drug use: No     Family Hx: The patient's family history includes Cancer in his father.  ROS:   Please see the history of present illness.     All other systems reviewed and are negative.   Prior CV studies:   The following studies were reviewed today:  none  Labs/Other Tests and Data Reviewed:    EKG:  EKG was personally reviewed from 08/25/2020 and showed NSR with no ST changes  Recent Labs: 08/25/2020: ALT 33 09/08/2020: BUN 21; Creatinine, Ser 1.00; Hemoglobin 11.7; Platelets 188; Potassium 4.5; Sodium 139   Recent Lipid Panel No results found for: CHOL, TRIG, HDL, CHOLHDL, LDLCALC, LDLDIRECT  Wt Readings from Last 3 Encounters:  10/13/20 189 lb (85.7 kg)  09/06/20 190 lb (86.2 kg)  08/25/20 200 lb (90.7 kg)     Risk Assessment/Calculations:      Objective:    Vital Signs:  BP 137/84   Pulse 85   Ht  5\' 7"  (1.702 m)   Wt 189 lb (85.7 kg)   BMI 29.60 kg/m    VITAL SIGNS:  reviewed GEN:  no acute distress EYES:  sclerae anicteric, EOMI - Extraocular Movements Intact RESPIRATORY:  normal respiratory effort, symmetric expansion CARDIOVASCULAR:  no peripheral edema SKIN:  no rash, lesions or ulcers. MUSCULOSKELETAL:  no obvious deformities. NEURO:  alert and oriented x 3, no obvious focal deficit PSYCH:  normal affect  ASSESSMENT & PLAN:    1. Dizziness -likely related to orthostatic hypotension accentuated  by pain meds and muscle relaxants from surgery -this does not sound like an arrhythmia and he had documented orthostasis in the hospital  2.  Orthostatic Hypotension -It sounds like he has been having problems with this for years but recently worsened since his surgery -I suspect that orthostasis is accentuated by his Cardura which is an alpha blocker and can cause significant drops in BP when standing -recommend getting off Gabapentin>>he stops this next Tuesday per Ortho -try to get off narcotics -also recommended seeing his Urologist to find another med for his prostate and get off Cardura -encouraged him to drink at least 64oz of fluid daily -check 2D echo to assess LVF -will send a Rx for fitted compression hose  3.  HTN -would avoid peripheral vasodilators -will continue Losartan at 25mg  daily for now and then have him check his BP twice daily for a week once off Cardura and call with the results. -Titrate Losartan as needed once off Cardura   COVID-19 Education: The signs and symptoms of COVID-19 were discussed with the patient and how to seek care for testing (follow up with PCP or arrange E-visit).  The importance of social distancing was discussed today.  Time:   Today, I have spent 20 minutes with the patient with telehealth technology discussing the above problems.     Medication Adjustments/Labs and Tests Ordered: Current medicines are reviewed at length  with the patient today.  Concerns regarding medicines are outlined above.   Tests Ordered: No orders of the defined types were placed in this encounter.   Medication Changes: No orders of the defined types were placed in this encounter.   Follow Up:  In Person in 3 month(s)  Signed, Armanda Magic, MD  10/13/2020 2:34 PM    [facility]

## 2020-10-14 DIAGNOSIS — R3914 Feeling of incomplete bladder emptying: Secondary | ICD-10-CM | POA: Diagnosis not present

## 2020-10-14 DIAGNOSIS — N401 Enlarged prostate with lower urinary tract symptoms: Secondary | ICD-10-CM | POA: Diagnosis not present

## 2020-10-14 DIAGNOSIS — M25562 Pain in left knee: Secondary | ICD-10-CM | POA: Diagnosis not present

## 2020-10-18 DIAGNOSIS — M25562 Pain in left knee: Secondary | ICD-10-CM | POA: Diagnosis not present

## 2020-10-21 DIAGNOSIS — M25562 Pain in left knee: Secondary | ICD-10-CM | POA: Diagnosis not present

## 2020-10-26 DIAGNOSIS — M25562 Pain in left knee: Secondary | ICD-10-CM | POA: Diagnosis not present

## 2020-10-29 DIAGNOSIS — M25562 Pain in left knee: Secondary | ICD-10-CM | POA: Diagnosis not present

## 2020-11-02 DIAGNOSIS — M25562 Pain in left knee: Secondary | ICD-10-CM | POA: Diagnosis not present

## 2020-11-02 DIAGNOSIS — M0609 Rheumatoid arthritis without rheumatoid factor, multiple sites: Secondary | ICD-10-CM | POA: Diagnosis not present

## 2020-11-03 MED ORDER — LOSARTAN POTASSIUM 50 MG PO TABS: 50.0000 mg | ORAL_TABLET | Freq: Every day | ORAL | 3 refills | Status: DC

## 2020-11-04 DIAGNOSIS — M25562 Pain in left knee: Secondary | ICD-10-CM | POA: Diagnosis not present

## 2020-11-05 ENCOUNTER — Ambulatory Visit (HOSPITAL_COMMUNITY): Payer: Medicare PPO | Attending: Cardiology

## 2020-11-05 ENCOUNTER — Other Ambulatory Visit: Payer: Self-pay

## 2020-11-05 DIAGNOSIS — I517 Cardiomegaly: Secondary | ICD-10-CM

## 2020-11-05 DIAGNOSIS — G4733 Obstructive sleep apnea (adult) (pediatric): Secondary | ICD-10-CM | POA: Diagnosis not present

## 2020-11-05 DIAGNOSIS — I10 Essential (primary) hypertension: Secondary | ICD-10-CM | POA: Diagnosis not present

## 2020-11-05 DIAGNOSIS — R42 Dizziness and giddiness: Secondary | ICD-10-CM

## 2020-11-05 DIAGNOSIS — I351 Nonrheumatic aortic (valve) insufficiency: Secondary | ICD-10-CM | POA: Diagnosis not present

## 2020-11-05 LAB — ECHOCARDIOGRAM COMPLETE
Area-P 1/2: 3.53 cm2
S' Lateral: 2.3 cm

## 2020-11-08 ENCOUNTER — Encounter: Payer: Self-pay | Admitting: Cardiology

## 2020-11-08 DIAGNOSIS — I77810 Thoracic aortic ectasia: Secondary | ICD-10-CM | POA: Insufficient documentation

## 2020-11-09 ENCOUNTER — Telehealth: Payer: Self-pay

## 2020-11-09 DIAGNOSIS — M25562 Pain in left knee: Secondary | ICD-10-CM | POA: Diagnosis not present

## 2020-11-09 DIAGNOSIS — I712 Thoracic aortic aneurysm, without rupture, unspecified: Secondary | ICD-10-CM

## 2020-11-09 NOTE — Telephone Encounter (Signed)
-----   Message from Quintella Reichert, MD sent at 11/08/2020  8:56 AM EST ----- Echo showed normal heart function with mildly thickened heart muscle, mildly leaky AV and mildly dilated aortic root at 61mm.  Please get a gated Chest CTA to assess dilated aorta further

## 2020-11-09 NOTE — Telephone Encounter (Signed)
The patient has been notified of the result and verbalized understanding.  All questions (if any) were answered. Theresia Majors, RN 11/09/2020 4:42 PM  CTA has been ordered.

## 2020-11-11 DIAGNOSIS — M25562 Pain in left knee: Secondary | ICD-10-CM | POA: Diagnosis not present

## 2020-11-16 ENCOUNTER — Other Ambulatory Visit: Payer: Self-pay

## 2020-11-16 ENCOUNTER — Other Ambulatory Visit: Payer: Medicare PPO | Admitting: *Deleted

## 2020-11-16 DIAGNOSIS — I712 Thoracic aortic aneurysm, without rupture, unspecified: Secondary | ICD-10-CM

## 2020-11-16 LAB — BASIC METABOLIC PANEL
BUN/Creatinine Ratio: 16 (ref 10–24)
BUN: 20 mg/dL (ref 8–27)
CO2: 22 mmol/L (ref 20–29)
Calcium: 9.1 mg/dL (ref 8.6–10.2)
Chloride: 102 mmol/L (ref 96–106)
Creatinine, Ser: 1.29 mg/dL — ABNORMAL HIGH (ref 0.76–1.27)
Glucose: 88 mg/dL (ref 65–99)
Potassium: 5.3 mmol/L — ABNORMAL HIGH (ref 3.5–5.2)
Sodium: 140 mmol/L (ref 134–144)
eGFR: 59 mL/min/{1.73_m2} — ABNORMAL LOW (ref >59)

## 2020-11-17 DIAGNOSIS — M25562 Pain in left knee: Secondary | ICD-10-CM | POA: Diagnosis not present

## 2020-11-22 ENCOUNTER — Telehealth: Payer: Self-pay

## 2020-11-22 DIAGNOSIS — I10 Essential (primary) hypertension: Secondary | ICD-10-CM

## 2020-11-22 MED ORDER — POTASSIUM CHLORIDE CRYS ER 20 MEQ PO TBCR: 20.0000 meq | EXTENDED_RELEASE_TABLET | Freq: Every day | ORAL | 3 refills | Status: DC

## 2020-11-22 NOTE — Telephone Encounter (Signed)
The patient has been notified of the result and verbalized understanding.  All questions (if any) were answered. Theresia Majors, RN 11/22/2020 2:24 PM

## 2020-11-22 NOTE — Telephone Encounter (Signed)
-----   Message from Quintella Reichert, MD sent at 11/16/2020  5:37 PM EDT ----- Stable labs - continue current meds and forward to PCP.  Please decrease Kdur to daily and  repeat BMET on Friday

## 2020-11-23 ENCOUNTER — Ambulatory Visit (INDEPENDENT_AMBULATORY_CARE_PROVIDER_SITE_OTHER)
Admission: RE | Admit: 2020-11-23 | Discharge: 2020-11-23 | Disposition: A | Discharge status: Home / Self Care | Payer: Medicare PPO | Source: Ambulatory Visit | Attending: Cardiology | Admitting: Cardiology

## 2020-11-23 ENCOUNTER — Other Ambulatory Visit: Payer: Self-pay

## 2020-11-23 ENCOUNTER — Encounter: Payer: Self-pay | Admitting: Cardiology

## 2020-11-23 DIAGNOSIS — I712 Thoracic aortic aneurysm, without rupture, unspecified: Secondary | ICD-10-CM

## 2020-11-23 DIAGNOSIS — R55 Syncope and collapse: Secondary | ICD-10-CM | POA: Diagnosis not present

## 2020-11-23 DIAGNOSIS — I700 Atherosclerosis of aorta: Secondary | ICD-10-CM | POA: Insufficient documentation

## 2020-11-23 HISTORY — DX: Atherosclerosis of aorta: I70.0

## 2020-11-23 MED ORDER — IOHEXOL 350 MG/ML SOLN
100.0000 mL | Freq: Once | INTRAVENOUS | Status: AC | PRN
  Administered 2020-11-23: 100 mL via INTRAVENOUS

## 2020-11-24 ENCOUNTER — Telehealth: Payer: Self-pay

## 2020-11-24 DIAGNOSIS — M25562 Pain in left knee: Secondary | ICD-10-CM | POA: Diagnosis not present

## 2020-11-24 NOTE — Telephone Encounter (Signed)
-----   Message from Quintella Reichert, MD sent at 11/23/2020 12:48 PM EDT ----- Mildly enlarged ascending aorta at 4.2cm with gallstones and aortic atherosclerosis.  There are also coronary arter calcifications. Please get a coronary Ca score.  Forward study to PCP to folowup on gallstones

## 2020-11-24 NOTE — Telephone Encounter (Signed)
The patient has been notified of the result and verbalized understanding.  All questions (if any) were answered. Theresia Majors, RN 11/24/2020 12:58 PM  CT calcium score has been ordered.

## 2020-11-25 ENCOUNTER — Other Ambulatory Visit: Payer: Self-pay

## 2020-11-25 ENCOUNTER — Other Ambulatory Visit: Payer: Medicare PPO

## 2020-11-25 DIAGNOSIS — I10 Essential (primary) hypertension: Secondary | ICD-10-CM

## 2020-11-25 LAB — BASIC METABOLIC PANEL
BUN/Creatinine Ratio: 19 (ref 10–24)
BUN: 23 mg/dL (ref 8–27)
CO2: 21 mmol/L (ref 20–29)
Calcium: 9.2 mg/dL (ref 8.6–10.2)
Chloride: 104 mmol/L (ref 96–106)
Creatinine, Ser: 1.22 mg/dL (ref 0.76–1.27)
Glucose: 86 mg/dL (ref 65–99)
Potassium: 4.8 mmol/L (ref 3.5–5.2)
Sodium: 140 mmol/L (ref 134–144)
eGFR: 63 mL/min/{1.73_m2} (ref >59)

## 2020-11-26 DIAGNOSIS — M25562 Pain in left knee: Secondary | ICD-10-CM | POA: Diagnosis not present

## 2020-11-29 DIAGNOSIS — M25562 Pain in left knee: Secondary | ICD-10-CM | POA: Diagnosis not present

## 2020-12-01 DIAGNOSIS — M25562 Pain in left knee: Secondary | ICD-10-CM | POA: Diagnosis not present

## 2020-12-07 DIAGNOSIS — B5800 Toxoplasma oculopathy, unspecified: Secondary | ICD-10-CM | POA: Diagnosis not present

## 2020-12-07 DIAGNOSIS — M150 Primary generalized (osteo)arthritis: Secondary | ICD-10-CM | POA: Diagnosis not present

## 2020-12-07 DIAGNOSIS — M1A09X0 Idiopathic chronic gout, multiple sites, without tophus (tophi): Secondary | ICD-10-CM | POA: Diagnosis not present

## 2020-12-07 DIAGNOSIS — Z6831 Body mass index (BMI) 31.0-31.9, adult: Secondary | ICD-10-CM | POA: Diagnosis not present

## 2020-12-07 DIAGNOSIS — M0609 Rheumatoid arthritis without rheumatoid factor, multiple sites: Secondary | ICD-10-CM | POA: Diagnosis not present

## 2020-12-07 DIAGNOSIS — E669 Obesity, unspecified: Secondary | ICD-10-CM | POA: Diagnosis not present

## 2020-12-07 DIAGNOSIS — M25562 Pain in left knee: Secondary | ICD-10-CM | POA: Diagnosis not present

## 2020-12-07 DIAGNOSIS — M3130 Wegener's granulomatosis without renal involvement: Secondary | ICD-10-CM | POA: Diagnosis not present

## 2020-12-08 DIAGNOSIS — M0609 Rheumatoid arthritis without rheumatoid factor, multiple sites: Secondary | ICD-10-CM | POA: Diagnosis not present

## 2020-12-09 DIAGNOSIS — M25562 Pain in left knee: Secondary | ICD-10-CM | POA: Diagnosis not present

## 2020-12-14 DIAGNOSIS — M25562 Pain in left knee: Secondary | ICD-10-CM | POA: Diagnosis not present

## 2020-12-16 DIAGNOSIS — M25562 Pain in left knee: Secondary | ICD-10-CM | POA: Diagnosis not present

## 2020-12-17 DIAGNOSIS — M25552 Pain in left hip: Secondary | ICD-10-CM | POA: Diagnosis not present

## 2020-12-23 ENCOUNTER — Ambulatory Visit (INDEPENDENT_AMBULATORY_CARE_PROVIDER_SITE_OTHER)
Admission: RE | Admit: 2020-12-23 | Discharge: 2020-12-23 | Disposition: A | Discharge status: Home / Self Care | Payer: Self-pay | Source: Ambulatory Visit | Attending: Cardiology | Admitting: Cardiology

## 2020-12-23 ENCOUNTER — Other Ambulatory Visit: Payer: Self-pay

## 2020-12-23 DIAGNOSIS — I10 Essential (primary) hypertension: Secondary | ICD-10-CM

## 2020-12-24 ENCOUNTER — Telehealth: Payer: Self-pay

## 2020-12-24 DIAGNOSIS — R931 Abnormal findings on diagnostic imaging of heart and coronary circulation: Secondary | ICD-10-CM

## 2020-12-24 DIAGNOSIS — I491 Atrial premature depolarization: Secondary | ICD-10-CM

## 2020-12-24 DIAGNOSIS — I700 Atherosclerosis of aorta: Secondary | ICD-10-CM

## 2020-12-24 DIAGNOSIS — I77810 Thoracic aortic ectasia: Secondary | ICD-10-CM

## 2020-12-24 NOTE — Telephone Encounter (Signed)
The patient has been notified of the result and verbalized understanding.  All questions (if any) were answered. Theresia Majors, RN 12/24/2020 11:24 AM

## 2020-12-24 NOTE — Telephone Encounter (Signed)
-----   Message from Quintella Reichert, MD sent at 12/23/2020  5:03 PM EDT ----- Very high coronary Ca score - please get a Lexiscan myoview and a copy of last FLP

## 2020-12-27 NOTE — Telephone Encounter (Signed)
Shared Decision Making/Informed Consent The risks [chest pain, shortness of breath, cardiac arrhythmias, dizziness, blood pressure fluctuations, myocardial infarction, stroke/transient ischemic attack, nausea, vomiting, allergic reaction, radiation exposure, metallic taste sensation and life-threatening complications (estimated to be 1 in 10,000)], benefits (risk stratification, diagnosing coronary artery disease, treatment guidance) and alternatives of a nuclear stress test were discussed in detail with Mr. Rahimi and he agrees to proceed.

## 2020-12-30 ENCOUNTER — Telehealth (HOSPITAL_COMMUNITY): Payer: Self-pay | Admitting: *Deleted

## 2020-12-30 NOTE — Telephone Encounter (Signed)
Patient given detailed instructions per Myocardial Perfusion Study Information Sheet for the test on 01/05/21. Patient notified to arrive 15 minutes early and that it is imperative to arrive on time for appointment to keep from having the test rescheduled.  If you need to cancel or reschedule your appointment, please call the office within 24 hours of your appointment. . Patient verbalized understanding. Arella Blinder Jacqueline    

## 2021-01-05 ENCOUNTER — Other Ambulatory Visit: Payer: Self-pay

## 2021-01-05 ENCOUNTER — Ambulatory Visit (HOSPITAL_COMMUNITY): Payer: Medicare PPO | Attending: Internal Medicine

## 2021-01-05 DIAGNOSIS — I2510 Atherosclerotic heart disease of native coronary artery without angina pectoris: Secondary | ICD-10-CM | POA: Diagnosis not present

## 2021-01-05 DIAGNOSIS — I10 Essential (primary) hypertension: Secondary | ICD-10-CM | POA: Insufficient documentation

## 2021-01-05 DIAGNOSIS — R931 Abnormal findings on diagnostic imaging of heart and coronary circulation: Secondary | ICD-10-CM | POA: Diagnosis not present

## 2021-01-05 LAB — MYOCARDIAL PERFUSION IMAGING
LV dias vol: 91 mL (ref 62–150)
LV sys vol: 40 mL
Peak HR: 75 {beats}/min
Rest HR: 57 {beats}/min
SDS: 0
SRS: 0
SSS: 0
TID: 1.01

## 2021-01-05 MED ORDER — TECHNETIUM TC 99M TETROFOSMIN IV KIT
10.4000 | PACK | Freq: Once | INTRAVENOUS | Status: AC | PRN
  Administered 2021-01-05: 10.4 via INTRAVENOUS
  Filled 2021-01-05: qty 11

## 2021-01-05 MED ORDER — REGADENOSON 0.4 MG/5ML IV SOLN
0.4000 mg | Freq: Once | INTRAVENOUS | Status: AC
  Administered 2021-01-05: 0.4 mg via INTRAVENOUS

## 2021-01-05 MED ORDER — TECHNETIUM TC 99M TETROFOSMIN IV KIT
32.5000 | PACK | Freq: Once | INTRAVENOUS | Status: AC | PRN
  Administered 2021-01-05: 32.5 via INTRAVENOUS
  Filled 2021-01-05: qty 33

## 2021-01-06 DIAGNOSIS — M0609 Rheumatoid arthritis without rheumatoid factor, multiple sites: Secondary | ICD-10-CM | POA: Diagnosis not present

## 2021-01-10 ENCOUNTER — Other Ambulatory Visit: Payer: Self-pay

## 2021-01-10 ENCOUNTER — Encounter: Payer: Self-pay | Admitting: Cardiology

## 2021-01-10 ENCOUNTER — Ambulatory Visit: Payer: Medicare PPO | Admitting: Cardiology

## 2021-01-10 VITALS — Ht 67.0 in | Wt 206.8 lb

## 2021-01-10 DIAGNOSIS — E7800 Pure hypercholesterolemia, unspecified: Secondary | ICD-10-CM

## 2021-01-10 DIAGNOSIS — E785 Hyperlipidemia, unspecified: Secondary | ICD-10-CM | POA: Insufficient documentation

## 2021-01-10 DIAGNOSIS — I951 Orthostatic hypotension: Secondary | ICD-10-CM | POA: Diagnosis not present

## 2021-01-10 DIAGNOSIS — R42 Dizziness and giddiness: Secondary | ICD-10-CM | POA: Diagnosis not present

## 2021-01-10 DIAGNOSIS — I77810 Thoracic aortic ectasia: Secondary | ICD-10-CM | POA: Diagnosis not present

## 2021-01-10 DIAGNOSIS — I351 Nonrheumatic aortic (valve) insufficiency: Secondary | ICD-10-CM

## 2021-01-10 DIAGNOSIS — R931 Abnormal findings on diagnostic imaging of heart and coronary circulation: Secondary | ICD-10-CM | POA: Diagnosis not present

## 2021-01-10 DIAGNOSIS — I10 Essential (primary) hypertension: Secondary | ICD-10-CM | POA: Diagnosis not present

## 2021-01-10 LAB — LIPID PANEL
Chol/HDL Ratio: 3.7 ratio (ref 0.0–5.0)
Cholesterol, Total: 136 mg/dL (ref 100–199)
HDL: 37 mg/dL — ABNORMAL LOW (ref >39)
LDL Chol Calc (NIH): 82 mg/dL (ref 0–99)
Triglycerides: 91 mg/dL (ref 0–149)
VLDL Cholesterol Cal: 17 mg/dL (ref 5–40)

## 2021-01-10 LAB — ALT: ALT: 30 IU/L (ref 0–44)

## 2021-01-10 NOTE — Patient Instructions (Addendum)
Medication Instructions:  Your physician recommends that you continue on your current medications as directed. Please refer to the Current Medication list given to you today.  *If you need a refill on your cardiac medications before your next appointment, please call your pharmacy*   Lab Work: TODAY: FLP and ALT If you have labs (blood work) drawn today and your tests are completely normal, you will receive your results only by: Marland Kitchen MyChart Message (if you have MyChart) OR . A paper copy in the mail If you have any lab test that is abnormal or we need to change your treatment, we will call you to review the results.   Testing/Procedures: Your physician has requested that you have an echocardiogram in one year. Echocardiography is a painless test that uses sound waves to create images of your heart. It provides your doctor with information about the size and shape of your heart and how well your heart's chambers and valves are working. This procedure takes approximately one hour. There are no restrictions for this procedure.  Follow-Up: At [facility], you and your health needs are our priority.  As part of our continuing mission to provide you with exceptional heart care, we have created designated Provider Care Teams.  These Care Teams include your primary Cardiologist (physician) and Advanced Practice Providers (APPs -  Physician Assistants and Nurse Practitioners) who all work together to provide you with the care you need, when you need it.   Your next appointment:   6 month(s)  The format for your next appointment:   In Person  Provider:   You may see Armanda Magic, MD or one of the following Advanced Practice Providers on your designated Care Team:    Ronie Spies, PA-C  Jacolyn Reedy, PA-C

## 2021-01-10 NOTE — Addendum Note (Signed)
Addended by: Theresia Majors on: 01/10/2021 10:22 AM   Modules accepted: Orders

## 2021-01-10 NOTE — Progress Notes (Addendum)
Date:  01/10/2021   ID:  Jacob Mayo, DOB [birth year], MRN [id number]  PCP:  Pcp, No  Cardiologist:  NEW Electrophysiologist:  None   Chief Complaint:  Orthostatic Hypotension and syncope  History of Present Illness:    Jacob Mayo is a 70 y.o. male with a hx of OSA not on CPAP and HTN. He recently had a TKR on 09/26/2020.  He did well with surgery but the first post op day his BP started to decrease.  He had problems with dizziness and was taking narcotics and Robaxin and his BP was noted to drop to 83/27mmHg when ambulating.  Prior to knee surgery he was on doxazosin 8mg  qhs and Losartan 25mg  daily and was discharged on these meds.   He started outpt PT and he started having problems with low BPs to the point he could not participate in PT. When BP started to drop during PT he was instructed to stop the losartan and doxazosin but then BP went up in PT and he restarted the Doxazosin.    When I saw him last it was felt that his orthostasis was accentuated by his cardura, gabapentin and pain meds.  His Gabapentin was stopped and he weaned off narcotics.  He was encouraged to drink at least 64oz of fluids daily.  He was given a Rx for compression hose. 2D echo showed normal LVF and mild AR and mildly dilated aortic root at 74mm.  He tried to go off Doxazosin and could not do it due to recurrent urinary retention .  He is here today for followup and is doing well.  He tells me that his dizziness had significantly improved and has not had any presyncope or syncope.  He occasionally will have a brief dizzy spell but nothing like before.  He denies any chest pain or pressure, SOB, DOE, PND, orthopnea, LE edema,  palpitations or syncope. He is compliant with his meds and is tolerating meds with no SE.    Past Medical History:  Diagnosis Date  . Agatston coronary artery calcium score greater than 400    Coronary Ca score 1963>>in all 3 coronary arteries and LM>>Lexiscan myoview 12/2020 showed no  ischemia  . Aortic atherosclerosis (HCC)   . Aortic insufficiency    mild by echo 2022  . Arthritis   . Dilated aortic root (HCC)    43mm by echo 11/2020 and 46mm by Chest CTA 11/2020  . HLD (hyperlipidemia)    LDL goal < 70  . Hypertension   . Sleep apnea    states this is no longer a problem since weight loss  . Wegener's granulomatosis (granulomatosis with polyangiitis) 1997   Open lung biopsy. Prednisone/Cytoxan   Past Surgical History:  Procedure Laterality Date  . APPENDECTOMY    . EYE SURGERY    . GASTRIC BYPASS  2006  . HERNIA REPAIR    . KNEE SURGERY    . LUNG BIOPSY  1997  . TOTAL KNEE ARTHROPLASTY Left 09/06/2020   Procedure: TOTAL KNEE ARTHROPLASTY;  Surgeon: Ollen Gross, MD;  Location: [facility];  Service: Orthopedics;  Laterality: Left;      Current Meds  Medication Sig  . abatacept (ORENCIA) 250 MG injection Inject into the vein every 30 (thirty) days.  Marland Kitchen alendronate (FOSAMAX) 70 MG tablet Take 70 mg by mouth every Wednesday. Take with a full glass of water on an empty stomach.  Marland Kitchen allopurinol (ZYLOPRIM) 100 MG tablet Take 200 mg by mouth  daily.  . diphenhydramine-acetaminophen (TYLENOL PM) 25-500 MG TABS Take 1 tablet by mouth at bedtime as needed (pain).  Marland Kitchen doxazosin (CARDURA) 8 MG tablet Take 8 mg by mouth daily.  . fish oil-omega-3 fatty acids 1000 MG capsule Take 2 g by mouth daily.   Marland Kitchen losartan (COZAAR) 50 MG tablet Take 1 tablet (50 mg total) by mouth daily. (Patient taking differently: Take 25 mg by mouth in the morning and at bedtime. TAKE HALF A TABLET 25MG  BY MOUTH TWICE DAILY.)  . methotrexate (50 MG/ML) 1 g injection Inject into the vein every Saturday. 0.6 mL  . Multiple Vitamin (MULTIVITAMIN) tablet Take 2 tablets by mouth daily.  . rosuvastatin (CRESTOR) 5 MG tablet Take 5 mg by mouth daily.  Monday sulfamethoxazole-trimethoprim (BACTRIM) 400-80 MG tablet Take 1 tablet by mouth 2 (two) times daily.  . vitamin B-12 (CYANOCOBALAMIN) 1000 MCG tablet  Take 1,000 mcg by mouth daily.     Allergies:   Patient has no known allergies.   Social History   Tobacco Use  . Smoking status: Never Smoker  . Smokeless tobacco: Never Used  Vaping Use  . Vaping Use: Never used  Substance Use Topics  . Alcohol use: Yes    Alcohol/week: 2.0 standard drinks    Types: 2 Glasses of wine per week    Comment: occas.  . Drug use: No     Family Hx: The patient's family history includes Cancer in his father.  ROS:   Please see the history of present illness.     All other systems reviewed and are negative.   Prior CV studies:   The following studies were reviewed today:  2D echo 11/2020 IMPRESSIONS  1. Left ventricular ejection fraction, by estimation, is 55 to 60%. The  left ventricle has normal function. The left ventricle has no regional  wall motion abnormalities. There is mild left ventricular hypertrophy.  Left ventricular diastolic parameters  were normal.  2. Right ventricular systolic function is normal. The right ventricular  size is normal. Tricuspid regurgitation signal is inadequate for assessing  PA pressure.  3. The mitral valve is normal in structure. No evidence of mitral valve  regurgitation.  4. The aortic valve was not well visualized. Aortic valve regurgitation  is mild. No aortic stenosis is present.  5. Aortic dilatation noted. There is dilatation of the aortic root,  measuring 43 mm.   Coronary Ca score 12/23/2020 FINDINGS: Coronary arteries: Normal origins.  Coronary Calcium Score:  Left main: 271  Left anterior descending artery: 941  Left circumflex artery: 392  Right coronary artery: 359  Total: 1963  Percentile: 92nd  Pericardium: Normal.  Ascending Aorta: 4.5 cm, moderate dilation.  Non-cardiac: See separate report from [facility].  IMPRESSION: Coronary calcium score of 1963 Agatston units. This was 92nd percentile for age-, race-, and sex-matched  controls.  [facility] 01/2021 Study Highlights    Nuclear stress EF: 56%.  The left ventricular ejection fraction is normal (55-65%).  There was no ST segment deviation noted during stress.  No T wave inversion was noted during stress.  This is a low risk study.   Negative stress tests for ischemia or infarction.  Ventricule is qualitatively dilated, but with normal volumes and TID. Low risk study.    Labs/Other Tests and Data Reviewed:    EKG:  EKG was not done today  Recent Labs: 08/25/2020: ALT 33 09/08/2020: Hemoglobin 11.7; Platelets 188 11/25/2020: BUN 23; Creatinine, Ser 1.22; Potassium 4.8; Sodium 140  Recent Lipid Panel No results found for: CHOL, TRIG, HDL, CHOLHDL, LDLCALC, LDLDIRECT  Wt Readings from Last 3 Encounters:  01/10/21 206 lb 12.8 oz (93.8 kg)  01/05/21 189 lb (85.7 kg)  10/13/20 189 lb (85.7 kg)     Risk Assessment/Calculations:      Objective:    Orthostatic VS for the past 24 hrs (Last 3 readings):  BP- Lying Pulse- Lying BP- Sitting Pulse- Sitting BP- Standing at 0 minutes Pulse- Standing at 0 minutes BP- Standing at 3 minutes Pulse- Standing at 3 minutes  01/10/21 0958 135/81 65 118/81 68 105/77 79 117/80 73   Vital Signs:  Ht 5\' 7"  (1.702 m)   Wt 206 lb 12.8 oz (93.8 kg)   SpO2 92%   BMI 32.39 kg/m   GEN: Well nourished, well developed in no acute distress HEENT: Normal NECK: No JVD; No carotid bruits LYMPHATICS: No lymphadenopathy CARDIAC:RRR, no murmurs, rubs, gallops RESPIRATORY:  Clear to auscultation without rales, wheezing or rhonchi  ABDOMEN: Soft, non-tender, non-distended MUSCULOSKELETAL:  No edema; No deformity  SKIN: Warm and dry NEUROLOGIC:  Alert and oriented x 3 PSYCHIATRIC:  Normal affect    ASSESSMENT & PLAN:    1.  Dizziness -likely related to orthostatic hypotension accentuated by pain meds and muscle relaxants from surgery -this does not sound like an arrhythmia and he had documented  orthostasis in the hospital  2.  Orthostatic Hypotension -It sounds like he has been having problems with this for years but recently worsened since his surgery -I suspect that orthostasis was accentuated by his Cardura which is an alpha blocker and can cause hypotension -he is now off gabapentin and pain meds -had to go back on Cardura due to urinary retention -2D echo showed normal LVF -his dizziness has significantly improved and no presyncope or syncope -encouraged him to continue to drink at least 64oz of fluid daily -he tried the thigh high compression hose but could not get them on  3.  HTN -BP is controlled on exam today -continue to avoid peripheral vasodilators -continue Losartan 25mg  BID  4.  Aortic insufficiency -mild by echo 2022  5.  Dilated aortic root and ascending aorta -aortic root measured at 100mm by echo 11/2020 -ascending aorta measured at 31mm by Chest CTA 11/2020 -repeat echo in 1 year to followup  6.  Coronary artery calcium -Coronary calcium score on 12/23/2020 was high at 1963 in all vessels including LM -Lexiscan myoview 01/2021 showed no ischemia -he was instructed to let me know if he develops exertional chest discomfort or SOB -continue Crestor  7.  HLD -LDL goal < 70 -he was started on statin -repeat FLp and ALT  Medication Adjustments/Labs and Tests Ordered: Current medicines are reviewed at length with the patient today.  Concerns regarding medicines are outlined above.   Tests Ordered: Orders Placed This Encounter  Procedures  . ALT  . Lipid panel  . ECHOCARDIOGRAM COMPLETE    Medication Changes: No orders of the defined types were placed in this encounter.   Follow Up:  In Person 6 months  Signed, 12/25/2020, MD  01/10/2021 10:22 AM    [facility]

## 2021-01-11 ENCOUNTER — Telehealth: Payer: Self-pay

## 2021-01-11 DIAGNOSIS — E7800 Pure hypercholesterolemia, unspecified: Secondary | ICD-10-CM

## 2021-01-11 MED ORDER — ROSUVASTATIN CALCIUM 10 MG PO TABS: 10.0000 mg | ORAL_TABLET | Freq: Every day | ORAL | 3 refills | Status: DC

## 2021-01-11 NOTE — Telephone Encounter (Signed)
-----   Message from Quintella Reichert, MD sent at 01/11/2021  9:38 AM EDT ----- LDL not at goal - increase Crestor to 10mg  daily and repeat FLp and ALT in 6 weeks

## 2021-01-11 NOTE — Telephone Encounter (Signed)
The patient has been notified of the result and verbalized understanding.  All questions (if any) were answered. Theresia Majors, RN 01/11/2021 1:26 PM  Patient will increase Crestor to 10 mg daily and repeat labs in 6 weeks.

## 2021-02-03 DIAGNOSIS — M0609 Rheumatoid arthritis without rheumatoid factor, multiple sites: Secondary | ICD-10-CM | POA: Diagnosis not present

## 2021-02-22 ENCOUNTER — Other Ambulatory Visit: Payer: Self-pay

## 2021-02-22 ENCOUNTER — Other Ambulatory Visit: Payer: Medicare PPO | Admitting: *Deleted

## 2021-02-22 DIAGNOSIS — E7800 Pure hypercholesterolemia, unspecified: Secondary | ICD-10-CM

## 2021-02-22 LAB — LIPID PANEL
Chol/HDL Ratio: 3.1 ratio (ref 0.0–5.0)
Cholesterol, Total: 112 mg/dL (ref 100–199)
HDL: 36 mg/dL — ABNORMAL LOW (ref >39)
LDL Chol Calc (NIH): 60 mg/dL (ref 0–99)
Triglycerides: 76 mg/dL (ref 0–149)
VLDL Cholesterol Cal: 16 mg/dL (ref 5–40)

## 2021-02-22 LAB — ALT: ALT: 23 IU/L (ref 0–44)

## 2021-03-09 DIAGNOSIS — M0609 Rheumatoid arthritis without rheumatoid factor, multiple sites: Secondary | ICD-10-CM | POA: Diagnosis not present

## 2021-04-06 DIAGNOSIS — M0609 Rheumatoid arthritis without rheumatoid factor, multiple sites: Secondary | ICD-10-CM | POA: Diagnosis not present

## 2021-04-12 DIAGNOSIS — H3581 Retinal edema: Secondary | ICD-10-CM | POA: Diagnosis not present

## 2021-04-12 DIAGNOSIS — B5801 Toxoplasma chorioretinitis: Secondary | ICD-10-CM | POA: Diagnosis not present

## 2021-04-12 DIAGNOSIS — H43821 Vitreomacular adhesion, right eye: Secondary | ICD-10-CM | POA: Diagnosis not present

## 2021-05-05 DIAGNOSIS — M0609 Rheumatoid arthritis without rheumatoid factor, multiple sites: Secondary | ICD-10-CM | POA: Diagnosis not present

## 2021-06-06 DIAGNOSIS — M0609 Rheumatoid arthritis without rheumatoid factor, multiple sites: Secondary | ICD-10-CM | POA: Diagnosis not present

## 2021-06-08 DIAGNOSIS — E669 Obesity, unspecified: Secondary | ICD-10-CM | POA: Diagnosis not present

## 2021-06-08 DIAGNOSIS — M0609 Rheumatoid arthritis without rheumatoid factor, multiple sites: Secondary | ICD-10-CM | POA: Diagnosis not present

## 2021-06-08 DIAGNOSIS — B5800 Toxoplasma oculopathy, unspecified: Secondary | ICD-10-CM | POA: Diagnosis not present

## 2021-06-08 DIAGNOSIS — M25562 Pain in left knee: Secondary | ICD-10-CM | POA: Diagnosis not present

## 2021-06-08 DIAGNOSIS — M150 Primary generalized (osteo)arthritis: Secondary | ICD-10-CM | POA: Diagnosis not present

## 2021-06-08 DIAGNOSIS — M3130 Wegener's granulomatosis without renal involvement: Secondary | ICD-10-CM | POA: Diagnosis not present

## 2021-06-08 DIAGNOSIS — M1A09X0 Idiopathic chronic gout, multiple sites, without tophus (tophi): Secondary | ICD-10-CM | POA: Diagnosis not present

## 2021-06-08 DIAGNOSIS — Z6831 Body mass index (BMI) 31.0-31.9, adult: Secondary | ICD-10-CM | POA: Diagnosis not present

## 2021-06-08 DIAGNOSIS — M7989 Other specified soft tissue disorders: Secondary | ICD-10-CM | POA: Diagnosis not present

## 2021-06-28 DIAGNOSIS — Z87442 Personal history of urinary calculi: Secondary | ICD-10-CM | POA: Diagnosis not present

## 2021-07-04 DIAGNOSIS — R3914 Feeling of incomplete bladder emptying: Secondary | ICD-10-CM | POA: Diagnosis not present

## 2021-07-04 DIAGNOSIS — M0609 Rheumatoid arthritis without rheumatoid factor, multiple sites: Secondary | ICD-10-CM | POA: Diagnosis not present

## 2021-07-04 DIAGNOSIS — N401 Enlarged prostate with lower urinary tract symptoms: Secondary | ICD-10-CM | POA: Diagnosis not present

## 2021-07-04 DIAGNOSIS — Z87442 Personal history of urinary calculi: Secondary | ICD-10-CM | POA: Diagnosis not present

## 2021-07-04 DIAGNOSIS — Z125 Encounter for screening for malignant neoplasm of prostate: Secondary | ICD-10-CM | POA: Diagnosis not present

## 2021-07-14 DIAGNOSIS — M109 Gout, unspecified: Secondary | ICD-10-CM | POA: Diagnosis not present

## 2021-07-14 DIAGNOSIS — E559 Vitamin D deficiency, unspecified: Secondary | ICD-10-CM | POA: Diagnosis not present

## 2021-07-14 DIAGNOSIS — Z0000 Encounter for general adult medical examination without abnormal findings: Secondary | ICD-10-CM | POA: Diagnosis not present

## 2021-07-14 DIAGNOSIS — M8580 Other specified disorders of bone density and structure, unspecified site: Secondary | ICD-10-CM | POA: Diagnosis not present

## 2021-07-14 DIAGNOSIS — M069 Rheumatoid arthritis, unspecified: Secondary | ICD-10-CM | POA: Diagnosis not present

## 2021-07-14 DIAGNOSIS — E782 Mixed hyperlipidemia: Secondary | ICD-10-CM | POA: Diagnosis not present

## 2021-07-14 DIAGNOSIS — N1831 Chronic kidney disease, stage 3a: Secondary | ICD-10-CM | POA: Diagnosis not present

## 2021-07-14 DIAGNOSIS — I10 Essential (primary) hypertension: Secondary | ICD-10-CM | POA: Diagnosis not present

## 2021-07-14 DIAGNOSIS — N400 Enlarged prostate without lower urinary tract symptoms: Secondary | ICD-10-CM | POA: Diagnosis not present

## 2021-07-19 DIAGNOSIS — M25561 Pain in right knee: Secondary | ICD-10-CM | POA: Diagnosis not present

## 2021-07-19 DIAGNOSIS — Z96652 Presence of left artificial knee joint: Secondary | ICD-10-CM | POA: Diagnosis not present

## 2021-07-21 DIAGNOSIS — U071 COVID-19: Secondary | ICD-10-CM | POA: Diagnosis not present

## 2021-07-21 DIAGNOSIS — Z6829 Body mass index (BMI) 29.0-29.9, adult: Secondary | ICD-10-CM | POA: Diagnosis not present

## 2021-07-25 ENCOUNTER — Encounter (HOSPITAL_COMMUNITY): Payer: Self-pay | Admitting: Radiology

## 2021-07-25 NOTE — Addendum Note (Signed)
Encounter addended by: Novella Olive on: 07/25/2021 11:04 AM  Actions taken: Letter saved

## 2021-08-02 DIAGNOSIS — Z79899 Other long term (current) drug therapy: Secondary | ICD-10-CM | POA: Diagnosis not present

## 2021-08-02 DIAGNOSIS — R5383 Other fatigue: Secondary | ICD-10-CM | POA: Diagnosis not present

## 2021-08-02 DIAGNOSIS — M0609 Rheumatoid arthritis without rheumatoid factor, multiple sites: Secondary | ICD-10-CM | POA: Diagnosis not present

## 2021-08-17 DIAGNOSIS — Z96652 Presence of left artificial knee joint: Secondary | ICD-10-CM | POA: Diagnosis not present

## 2021-08-30 DIAGNOSIS — Z79899 Other long term (current) drug therapy: Secondary | ICD-10-CM | POA: Diagnosis not present

## 2021-08-30 DIAGNOSIS — M0609 Rheumatoid arthritis without rheumatoid factor, multiple sites: Secondary | ICD-10-CM | POA: Diagnosis not present

## 2021-09-14 DIAGNOSIS — M1A09X0 Idiopathic chronic gout, multiple sites, without tophus (tophi): Secondary | ICD-10-CM | POA: Diagnosis not present

## 2021-09-14 DIAGNOSIS — M79642 Pain in left hand: Secondary | ICD-10-CM | POA: Diagnosis not present

## 2021-09-14 DIAGNOSIS — M0609 Rheumatoid arthritis without rheumatoid factor, multiple sites: Secondary | ICD-10-CM | POA: Diagnosis not present

## 2021-09-14 DIAGNOSIS — Z6829 Body mass index (BMI) 29.0-29.9, adult: Secondary | ICD-10-CM | POA: Diagnosis not present

## 2021-09-14 DIAGNOSIS — R7989 Other specified abnormal findings of blood chemistry: Secondary | ICD-10-CM | POA: Diagnosis not present

## 2021-09-14 DIAGNOSIS — M150 Primary generalized (osteo)arthritis: Secondary | ICD-10-CM | POA: Diagnosis not present

## 2021-09-14 DIAGNOSIS — M3130 Wegener's granulomatosis without renal involvement: Secondary | ICD-10-CM | POA: Diagnosis not present

## 2021-09-14 DIAGNOSIS — B5800 Toxoplasma oculopathy, unspecified: Secondary | ICD-10-CM | POA: Diagnosis not present

## 2021-09-14 DIAGNOSIS — M25562 Pain in left knee: Secondary | ICD-10-CM | POA: Diagnosis not present

## 2021-10-03 DIAGNOSIS — M0609 Rheumatoid arthritis without rheumatoid factor, multiple sites: Secondary | ICD-10-CM | POA: Diagnosis not present

## 2021-10-12 DIAGNOSIS — H35431 Paving stone degeneration of retina, right eye: Secondary | ICD-10-CM | POA: Diagnosis not present

## 2021-10-12 DIAGNOSIS — H43821 Vitreomacular adhesion, right eye: Secondary | ICD-10-CM | POA: Diagnosis not present

## 2021-10-12 DIAGNOSIS — H3581 Retinal edema: Secondary | ICD-10-CM | POA: Diagnosis not present

## 2021-10-12 DIAGNOSIS — B5801 Toxoplasma chorioretinitis: Secondary | ICD-10-CM | POA: Diagnosis not present

## 2021-10-31 DIAGNOSIS — M0609 Rheumatoid arthritis without rheumatoid factor, multiple sites: Secondary | ICD-10-CM | POA: Diagnosis not present

## 2021-11-08 ENCOUNTER — Other Ambulatory Visit (HOSPITAL_COMMUNITY): Payer: Self-pay

## 2021-11-08 ENCOUNTER — Ambulatory Visit: Payer: Medicare PPO | Admitting: Infectious Disease

## 2021-11-08 ENCOUNTER — Other Ambulatory Visit: Payer: Self-pay

## 2021-11-08 ENCOUNTER — Encounter: Payer: Self-pay | Admitting: Infectious Disease

## 2021-11-08 VITALS — BP 145/87 | HR 62 | Temp 98.1°F | Resp 16 | Ht 68.0 in | Wt 197.2 lb

## 2021-11-08 DIAGNOSIS — B5801 Toxoplasma chorioretinitis: Secondary | ICD-10-CM | POA: Diagnosis not present

## 2021-11-08 DIAGNOSIS — M057A Rheumatoid arthritis with rheumatoid factor of other specified site without organ or systems involvement: Secondary | ICD-10-CM

## 2021-11-08 DIAGNOSIS — Z882 Allergy status to sulfonamides status: Secondary | ICD-10-CM | POA: Diagnosis not present

## 2021-11-08 DIAGNOSIS — E875 Hyperkalemia: Secondary | ICD-10-CM | POA: Diagnosis not present

## 2021-11-08 DIAGNOSIS — M069 Rheumatoid arthritis, unspecified: Secondary | ICD-10-CM | POA: Insufficient documentation

## 2021-11-08 DIAGNOSIS — M3130 Wegener's granulomatosis without renal involvement: Secondary | ICD-10-CM | POA: Insufficient documentation

## 2021-11-08 HISTORY — DX: Allergy status to sulfonamides: Z88.2

## 2021-11-08 HISTORY — DX: Toxoplasma chorioretinitis: B58.01

## 2021-11-08 HISTORY — DX: Hyperkalemia: E87.5

## 2021-11-08 NOTE — Progress Notes (Signed)
Reason for infectious disease consult: Toxoplasma chorioretinitis  Requesting Physician: Stephannie LiJason Sanders MD  Subjective:    Patient ID: Jacob SmartJames D Mayo, male    DOB: 11/22/1950, 71 y.o.   MRN: [id number]  HPI  Jacob MarshallJimmy is a 71 year old Caucasian man with a past medical history significant for coronary artery disease hyperlipidemia Wegener's granulomatosis and rheumatoid arthritis who was diagnosed with toxoplasma chorioretinitis in 2016.  He was treated Stephannie LiJason Sanders with ophthalmologic intervention as well as systemic antibiotics.  He was treated for roughly 4 months afterwards he says.  He then had a recurrence roughly a year ago having been off Bactrim but now the disease is quiescent.  He was referred to us and Dr. Allyne GeeSanders for consideration of whether or not he still needed to be on Bactrim chronically.  The patient is immunosuppressed on Orencia which she receives from [facility] Dierdre [facility]Beekman [facility].  He is currently on Bactrim single strength tablet twice daily.  He appears to be tolerating it quite well and his potassium and creatinine have been relatively normal although we did notice elevated potassium 11 months ago.  He is on losartan which will raise potassium and he told me that he was taking potassium pills that he was prescribed by urology roughly 6 years ago.  I have asked him to stop the potassium pills as I do not see that he needs this with 2 potassium elevating agents on board to treat his blood pressure and to prevent toxoplasma recurrence.   Past Medical History:  Diagnosis Date   Agatston coronary artery calcium score greater than 400    Coronary Ca score 1963>>in all 3 coronary arteries and LM>>Lexiscan myoview 12/2020 showed no ischemia   Aortic atherosclerosis (HCC)    Aortic insufficiency    mild by echo 2022   Arthritis    Dilated aortic root (HCC)    43mm by echo 11/2020 and 42mm by Chest CTA 11/2020   HLD (hyperlipidemia)    LDL goal < 70    Hypertension    Sleep apnea    states this is no longer a problem since weight loss   Wegener's granulomatosis (granulomatosis with polyangiitis) 1997   Open lung biopsy. Prednisone/Cytoxan    Past Surgical History:  Procedure Laterality Date   APPENDECTOMY     EYE SURGERY     GASTRIC BYPASS  2006   HERNIA REPAIR     KNEE SURGERY     LUNG BIOPSY  1997   TOTAL KNEE ARTHROPLASTY Left 09/06/2020   Procedure: TOTAL KNEE ARTHROPLASTY;  Surgeon: Ollen GrossAluisio, Frank, MD;  Location: [facility];  Service: Orthopedics;  Laterality: Left;  50min    Family History  Problem Relation Age of Onset   Cancer Father       Social History   Socioeconomic History   Marital status: Married    Spouse name: Not on file   Number of children: 2   Years of education: Not on file   Highest education level: Not on file  Occupational History   Occupation: Runner, broadcasting/film/videoTeacher    Employer: [employer]  Tobacco Use   Smoking status: Never   Smokeless tobacco: Never  Vaping Use   Vaping Use: Never used  Substance and Sexual Activity   Alcohol use: Yes    Alcohol/week: 2.0 standard drinks    Types: 2 Glasses of wine per week    Comment: occas.   Drug use: No   Sexual activity: Not on file  Other Topics  Concern   Not on file  Social History Narrative   Not on file   Social Determinants of Health   Financial Resource Strain: Not on file  Food Insecurity: Not on file  Transportation Needs: Not on file  Physical Activity: Not on file  Stress: Not on file  Social Connections: Not on file    No Known Allergies   Current Outpatient Medications:    abatacept (ORENCIA) 250 MG injection, Inject into the vein every 30 (thirty) days., Disp: , Rfl:    alendronate (FOSAMAX) 70 MG tablet, Take 70 mg by mouth every Wednesday. Take with a full glass of water on an empty stomach., Disp: , Rfl:    allopurinol (ZYLOPRIM) 100 MG tablet, Take 200 mg by mouth daily., Disp: , Rfl:    diphenhydramine-acetaminophen  (TYLENOL PM) 25-500 MG TABS, Take 1 tablet by mouth at bedtime as needed (pain)., Disp: , Rfl:    doxazosin (CARDURA) 8 MG tablet, Take 8 mg by mouth daily., Disp: , Rfl:    fish oil-omega-3 fatty acids 1000 MG capsule, Take 2 g by mouth daily. , Disp: , Rfl:    losartan (COZAAR) 50 MG tablet, Take 1 tablet (50 mg total) by mouth daily. (Patient taking differently: Take 25 mg by mouth in the morning and at bedtime. TAKE HALF A TABLET  BY MOUTH TWICE DAILY.), Disp: 90 tablet, Rfl: 3   methotrexate (50 MG/ML) 1 g injection, Inject into the vein every Saturday. 0.6 mL, Disp: , Rfl:    Multiple Vitamin (MULTIVITAMIN) tablet, Take 2 tablets by mouth daily., Disp: , Rfl:    rosuvastatin (CRESTOR) 10 MG tablet, Take 1 tablet (10 mg total) by mouth daily., Disp: 90 tablet, Rfl: 3   sulfamethoxazole-trimethoprim (BACTRIM) 400-80 MG tablet, Take 1 tablet by mouth 2 (two) times daily., Disp: , Rfl:    vitamin B-12 (CYANOCOBALAMIN) 1000 MCG tablet, Take 1,000 mcg by mouth daily., Disp: , Rfl:     Review of Systems  Constitutional:  Negative for activity change, appetite change, chills, diaphoresis, fatigue, fever and unexpected weight change.  HENT:  Negative for congestion, rhinorrhea, sinus pressure, sneezing, sore throat and trouble swallowing.   Eyes:  Negative for photophobia and visual disturbance.  Respiratory:  Negative for cough, chest tightness, shortness of breath, wheezing and stridor.   Cardiovascular:  Negative for chest pain, palpitations and leg swelling.  Gastrointestinal:  Negative for abdominal distention, abdominal pain, anal bleeding, blood in stool, constipation, diarrhea, nausea and vomiting.  Genitourinary:  Negative for difficulty urinating, dysuria, flank pain and hematuria.  Musculoskeletal:  Negative for arthralgias, back pain, gait problem, joint swelling and myalgias.  Skin:  Negative for color change, pallor, rash and wound.  Neurological:  Negative for dizziness,  tremors, weakness and light-headedness.  Hematological:  Negative for adenopathy. Does not bruise/bleed easily.  Psychiatric/Behavioral:  Negative for agitation, behavioral problems, confusion, decreased concentration, dysphoric mood and sleep disturbance.       Objective:   Physical Exam Constitutional:      Appearance: He is well-developed.  HENT:     Head: Normocephalic and atraumatic.  Eyes:     Conjunctiva/sclera: Conjunctivae normal.  Cardiovascular:     Rate and Rhythm: Normal rate and regular rhythm.  Pulmonary:     Effort: Pulmonary effort is normal. No respiratory distress.     Breath sounds: No wheezing.  Abdominal:     General: There is no distension.     Palpations: Abdomen is soft.  Musculoskeletal:  General: No tenderness. Normal range of motion.     Cervical back: Normal range of motion and neck supple.  Skin:    General: Skin is warm and dry.     Coloration: Skin is not pale.     Findings: No erythema or rash.  Neurological:     General: No focal deficit present.     Mental Status: He is alert and oriented to person, place, and time.  Psychiatric:        Mood and Affect: Mood normal.        Behavior: Behavior normal.        Thought Content: Thought content normal.        Judgment: Judgment normal.          Assessment & Plan:   Toxoplasma chorioretinitis:  Given his immunosuppressed status with Orencia, and the fact that he has had recurrence already once of this infection in the eye I think it is wise to keep him on chronic suppressive antibiotics and he is doing well on single strength tablet twice daily.  There was a brilliant study that showed double strength tablet of Bactrim every 3 days good significant reduced recurrences but the patient finds every 3-day dosing becoming difficult to keep up with.  Hyperkalemia: This did happen slightly most roughly 11 months ago in the context of losartan Bactrim and also potassium pills if I understand  the patient correctly.  I have asked him to stop the potassium pills and come back to clinic in a month and bring all of his medications so we can know exactly what medicines he is taking and recheck his BMP without the potassium supplements on board.  Rheumatoid arthritis: On Orencia and followed by Alben Deeds.  Is also on methotrexate.   Sulfa allergy he had an reaction to sulfa containing drug while he was hospitalized and being treated for Wegener's granulomatosis but he has had no trouble with Bactrim whatsoever.  HTN: on losartan which can also elevate K   I spent 82 minutes with the patient including than 50% of the time in face to face counseling of the patient guarding the nature of toxoplasmosis and recurrent risk in the context of immunosuppression going over potential toxicities of antibiotics largely concerned about hyperkalemia and elevated creatinine with Bactrim, reviewing medical literature with regards to toxoplasmosis in the eye along with reviewing along with review of medical records in preparation for the visit and during the visit and in coordination of his  car with Dr Allyne Gee who I will call today.

## 2021-11-29 DIAGNOSIS — Z79899 Other long term (current) drug therapy: Secondary | ICD-10-CM | POA: Diagnosis not present

## 2021-11-29 DIAGNOSIS — M0609 Rheumatoid arthritis without rheumatoid factor, multiple sites: Secondary | ICD-10-CM | POA: Diagnosis not present

## 2021-12-13 ENCOUNTER — Ambulatory Visit (INDEPENDENT_AMBULATORY_CARE_PROVIDER_SITE_OTHER): Payer: Medicare PPO | Admitting: Infectious Disease

## 2021-12-13 ENCOUNTER — Other Ambulatory Visit: Payer: Self-pay

## 2021-12-13 VITALS — Temp 98.6°F | Ht 67.0 in | Wt 196.0 lb

## 2021-12-13 DIAGNOSIS — I10 Essential (primary) hypertension: Secondary | ICD-10-CM

## 2021-12-13 DIAGNOSIS — E875 Hyperkalemia: Secondary | ICD-10-CM | POA: Diagnosis not present

## 2021-12-13 DIAGNOSIS — B5801 Toxoplasma chorioretinitis: Secondary | ICD-10-CM

## 2021-12-13 DIAGNOSIS — M3130 Wegener's granulomatosis without renal involvement: Secondary | ICD-10-CM

## 2021-12-13 DIAGNOSIS — R931 Abnormal findings on diagnostic imaging of heart and coronary circulation: Secondary | ICD-10-CM

## 2021-12-13 DIAGNOSIS — M057A Rheumatoid arthritis with rheumatoid factor of other specified site without organ or systems involvement: Secondary | ICD-10-CM

## 2021-12-13 NOTE — Progress Notes (Signed)
? ? ?Chief complaint: Follow-up for toxoplasma chorioretinitis ?Subjective:  ? ? Patient ID: Jacob SmartJames D Mayo, male    DOB: 03/20/1951, 71 y.o.   MRN: [id number] ? ?HPI ? ?Jacob MarshallJimmy is a 71 year old Caucasian man with a past medical history significant for coronary artery disease hyperlipidemia Wegener's granulomatosis and rheumatoid arthritis who was diagnosed with toxoplasma chorioretinitis in 2016. ? ?He was treated Stephannie LiJason Sanders with ophthalmologic intervention as well as systemic antibiotics. ? ?He was treated for roughly 4 months afterwards he says.  He then had a recurrence roughly a year ago having been off Bactrim but now the disease is quiescent. ? ?He was referred to us and Dr. Allyne GeeSanders for consideration of whether or not he still needed to be on Bactrim chronically.  The patient is immunosuppressed on Orencia which she receives from [facility]. ? ?He is currently on Bactrim single strength tablet twice daily. ? ?He appears to be tolerating it quite well and his potassium and creatinine have been relatively normal although we did notice elevated potassium 11 months ago.  He is on losartan which will raise potassium and he told me that he was taking potassium pills that he was prescribed by urology roughly 6 years ago.  I have asked him to stop the potassium pills as I do not see that he needs this with 2 potassium elevating agents on board to treat his blood pressure and to prevent toxoplasma recurrence. ? ?Turns to clinic for follow-up today.  He is doing well we going to check a metabolic panel to assess his creatinine and potassium in the context of Bactrim losartan. ? ? ? ? ?Past Medical History:  ?Diagnosis Date  ? Agatston coronary artery calcium score greater than 400   ? Coronary Ca score 1963>>in all 3 coronary arteries and LM>>Lexiscan myoview 12/2020 showed no ischemia  ? Allergy to sulfa drugs 11/08/2021  ? Aortic atherosclerosis (HCC)   ? Aortic insufficiency   ? mild by  echo 2022  ? Arthritis   ? Dilated aortic root (HCC)   ? 43mm by echo 11/2020 and 42mm by Chest CTA 11/2020  ? HLD (hyperlipidemia)   ? LDL goal < 70  ? Hyperkalemia 11/08/2021  ? Hypertension   ? Sleep apnea   ? states this is no longer a problem since weight loss  ? 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 Wegener's granulomatosis (granulomatosis with polyangiitis) 1997  ? Open lung biopsy. Prednisone/Cytoxan  ? ? ?Past Surgical History:  ?Procedure Laterality Date  ? APPENDECTOMY    ? EYE SURGERY    ? GASTRIC BYPASS  2006  ? HERNIA REPAIR    ? KNEE SURGERY    ? LUNG BIOPSY  1997  ? TOTAL KNEE ARTHROPLASTY Left 09/06/2020  ? Procedure: TOTAL KNEE ARTHROPLASTY;  Surgeon: Ollen GrossAluisio, Frank, MD;  Location: [facility];  Service: Orthopedics;  Laterality: Left;  50min  ? ? ?Family History  ?Problem Relation Age of Onset  ? Cancer Father   ? ? ?  ?Social History  ? ?Socioeconomic History  ? Marital status: Married  ?  Spouse name: Not on file  ? Number of children: 2  ? Years of education: Not on file  ? Highest education level: Not on file  ?Occupational History  ? Occupation: Runner, broadcasting/film/videoTeacher  ?  Employer: [employer]  ?Tobacco Use  ? Smoking status: Never  ? Smokeless tobacco: Never  ?Vaping Use  ? Vaping Use: Never used  ?Substance and Sexual Activity  ?  Alcohol use: Yes  ?  Alcohol/week: 2.0 standard drinks  ?  Types: 2 Glasses of wine per week  ?  Comment: occas.  ? Drug use: No  ? Sexual activity: Not on file  ?Other Topics Concern  ? Not on file  ?Social History Narrative  ? Not on file  ? ?Social Determinants of Health  ? ?Financial Resource Strain: Not on file  ?Food Insecurity: Not on file  ?Transportation Needs: Not on file  ?Physical Activity: Not on file  ?Stress: Not on file  ?Social Connections: Not on file  ? ? ?No Known Allergies ? ? ?Current Outpatient Medications:  ?  abatacept (ORENCIA) 250 MG injection, Inject into the vein every 30 (thirty) days., Disp: , Rfl:  ?  alendronate (FOSAMAX) 70 MG tablet, Take 70  mg by mouth every Wednesday. Take with a full glass of water on an empty stomach., Disp: , Rfl:  ?  allopurinol (ZYLOPRIM) 100 MG tablet, Take 200 mg by mouth daily., Disp: , Rfl:  ?  diphenhydramine-acetaminophen (TYLENOL PM) 25-500 MG TABS, Take 1 tablet by mouth at bedtime as needed (pain)., Disp: , Rfl:  ?  doxazosin (CARDURA) 8 MG tablet, Take 8 mg by mouth daily., Disp: , Rfl:  ?  fish oil-omega-3 fatty acids 1000 MG capsule, Take 2 g by mouth daily. , Disp: , Rfl:  ?  losartan (COZAAR) 50 MG tablet, Take 1 tablet (50 mg total) by mouth daily. (Patient taking differently: Take 25 mg by mouth in the morning and at bedtime. TAKE HALF A TABLET  BY MOUTH TWICE DAILY.), Disp: 90 tablet, Rfl: 3 ?  methotrexate (50 MG/ML) 1 g injection, Inject into the vein every Saturday. 0.6 mL, Disp: , Rfl:  ?  Multiple Vitamin (MULTIVITAMIN) tablet, Take 2 tablets by mouth daily., Disp: , Rfl:  ?  rosuvastatin (CRESTOR) 10 MG tablet, Take 1 tablet (10 mg total) by mouth daily., Disp: 90 tablet, Rfl: 3 ?  sulfamethoxazole-trimethoprim (BACTRIM) 400-80 MG tablet, Take 1 tablet by mouth 2 (two) times daily., Disp: , Rfl:  ?  vitamin B-12 (CYANOCOBALAMIN) 1000 MCG tablet, Take 1,000 mcg by mouth daily., Disp: , Rfl:  ? ? ? ?Review of Systems  ?Constitutional:  Negative for activity change, appetite change, chills, diaphoresis, fatigue, fever and unexpected weight change.  ?HENT:  Negative for congestion, rhinorrhea, sinus pressure, sneezing, sore throat and trouble swallowing.   ?Eyes:  Negative for photophobia and visual disturbance.  ?Respiratory:  Negative for cough, chest tightness, shortness of breath, wheezing and stridor.   ?Cardiovascular:  Negative for chest pain, palpitations and leg swelling.  ?Gastrointestinal:  Negative for abdominal distention, abdominal pain, anal bleeding, blood in stool, constipation, diarrhea, nausea and vomiting.  ?Genitourinary:  Negative for difficulty urinating, dysuria, flank pain and  hematuria.  ?Musculoskeletal:  Negative for arthralgias, back pain, gait problem, joint swelling and myalgias.  ?Skin:  Negative for color change, pallor, rash and wound.  ?Neurological:  Negative for dizziness, tremors, weakness and light-headedness.  ?Hematological:  Negative for adenopathy. Does not bruise/bleed easily.  ?Psychiatric/Behavioral:  Negative for agitation, behavioral problems, confusion, decreased concentration, dysphoric mood and sleep disturbance.   ? ?   ?Objective:  ? Physical Exam ?Constitutional:   ?   Appearance: He is well-developed.  ?HENT:  ?   Head: Normocephalic and atraumatic.  ?Eyes:  ?   Conjunctiva/sclera: Conjunctivae normal.  ?Cardiovascular:  ?   Rate and Rhythm: Normal rate and regular rhythm.  ?Pulmonary:  ?  Effort: Pulmonary effort is normal. No respiratory distress.  ?   Breath sounds: No wheezing.  ?Abdominal:  ?   General: There is no distension.  ?   Palpations: Abdomen is soft.  ?Musculoskeletal:     ?   General: No tenderness. Normal range of motion.  ?   Cervical back: Normal range of motion and neck supple.  ?Skin: ?   General: Skin is warm and dry.  ?   Coloration: Skin is not pale.  ?   Findings: No erythema or rash.  ?Neurological:  ?   General: No focal deficit present.  ?   Mental Status: He is alert and oriented to person, place, and time.  ?Psychiatric:     ?   Mood and Affect: Mood normal.     ?   Behavior: Behavior normal.     ?   Thought Content: Thought content normal.     ?   Judgment: Judgment normal.  ? ? ? ? ? ?   ?Assessment & Plan:  ?Toxoplasma chorioretinitis: ? ? ?Given his immunosuppressed status with Orencia, and the fact that he has had recurrence already once of this infection in the eye I think it is wise to keep him on chronic suppressive antibiotics and he is doing well on single strength tablet twice daily.   ? ?I am happy to take over prescribing this medicine though he is also happy to have Dr. Allyne Gee rx it. I will see what Stephannie Li  and the patient would like and if they want me to "own" this biotic prescription that I would want to make sure that I see him once a year to continue it. ? ?Hyperkalemia: Recheck metabolic panel today ? ?Rheu

## 2021-12-14 LAB — BASIC METABOLIC PANEL WITH GFR
BUN: 21 mg/dL (ref 7–25)
CO2: 23 mmol/L (ref 20–32)
Calcium: 8.9 mg/dL (ref 8.6–10.3)
Chloride: 111 mmol/L — ABNORMAL HIGH (ref 98–110)
Creat: 1.09 mg/dL (ref 0.70–1.28)
Glucose, Bld: 81 mg/dL (ref 65–99)
Potassium: 4.5 mmol/L (ref 3.5–5.3)
Sodium: 142 mmol/L (ref 135–146)
eGFR: 72 mL/min/{1.73_m2} (ref >=60)

## 2021-12-19 ENCOUNTER — Ambulatory Visit (HOSPITAL_COMMUNITY): Payer: Medicare PPO | Attending: Cardiology

## 2021-12-19 DIAGNOSIS — I351 Nonrheumatic aortic (valve) insufficiency: Secondary | ICD-10-CM | POA: Diagnosis not present

## 2021-12-19 DIAGNOSIS — I77810 Thoracic aortic ectasia: Secondary | ICD-10-CM

## 2021-12-19 LAB — ECHOCARDIOGRAM COMPLETE
Area-P 1/2: 3.48 cm2
P 1/2 time: 521 msec
S' Lateral: 3.3 cm

## 2021-12-21 ENCOUNTER — Telehealth: Payer: Self-pay

## 2021-12-21 DIAGNOSIS — I351 Nonrheumatic aortic (valve) insufficiency: Secondary | ICD-10-CM

## 2021-12-21 DIAGNOSIS — I77810 Thoracic aortic ectasia: Secondary | ICD-10-CM

## 2021-12-21 NOTE — Telephone Encounter (Signed)
-----   Message from Quintella Reichert, MD sent at 12/19/2021  2:29 PM EDT ----- ?Echo showed normal heart function with mildly thickened heart muscle mildly enlarged RV and mild to moderate leakiness of the aortic valve.  There is also dilatation of the aortic root at 43 mm and ascending at 45 mm.  Please get a gated chest CTA to reassess aortic aneurysm ?

## 2021-12-21 NOTE — Telephone Encounter (Signed)
The patient has been notified of the result and verbalized understanding.  All questions (if any) were answered. ?Antonieta Iba, RN 12/21/2021 4:48 PM  ?CTA has been ordered. ?

## 2021-12-27 ENCOUNTER — Encounter: Payer: Self-pay | Admitting: Physician Assistant

## 2021-12-27 DIAGNOSIS — M0609 Rheumatoid arthritis without rheumatoid factor, multiple sites: Secondary | ICD-10-CM | POA: Diagnosis not present

## 2021-12-27 NOTE — Progress Notes (Signed)
? ?Cardiology Office Note   ? ?Date:  12/29/2021  ? ?ID:  Jacob Mayo, DOB 05-Dec-1950, MRN [id number] ? ?PCP:  Pcp, No  ?Cardiologist:  Armanda Magic, MD  ?Electrophysiologist:  None  ? ?Chief Complaint: f/u coronary calcification, thoracic aortic aneurysm ? ?History of Present Illness:  ? ?Jacob Mayo is a 71 y.o. male with history of HTN with prior orthostatic hypotension, thoracic aortic aneurysm, mild-moderate AI by echo 12/2021, coronary calcification, aortic atherosclerosis, HLD, arthritis, HTN, first degree AVB, Wegener's granulomatosis, OSA not requiring CPAP who is seen for followup. ? ?Per chart review he has a history of orthostatic hypotension following knee replacement in 09/2020. Losartan and doxazosin were stopped, then doxazosin was restarted for elevated BP. It was felt perhaps gabapentin and pain meds were also contributing. CAC 12/2020 was 1963 or 92nd%ile with follow-up nuc 01/2021 which was normal. He also has mild aneurysmal dilation of thoracic aorta, last assessed 4.5cm in 12/2020 with repeat CT pending. Last echo 12/19/21 EF 55-60%, mild LVH, 45mm ascending aorta, 43mm dilation of aortic root, mild-moderate AI. ? ?He is seen for follow-up today overall doing well. He denies any recent cardiac symptoms of CP, SOB, palpitations, dizziness, syncope. 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 We discussed his aneurysm in depth today. ? ? ?Labwork independently reviewed: ?12/13/21 K 4.5, Cr 1.09 ?11/2021 Hgb 12.5 ?07/2021 HDL 41, LDL 68, trig 84, TSH wnl ?04/2955 ALT wnl, trig 76, LDL 60 ?09/2020 Hgb 11.7, plt ok ? ? ?Cardiology Studies:  ? ?Studies reviewed are outlined and summarized above. Reports included below if pertinent.  ? ?2d echo 12/19/21 ? ? 1. Left ventricular ejection fraction, by estimation, is 55 to 60%. The  ?left ventricle has normal function. The left ventricle has no regional  ?wall motion abnormalities. There is mild left ventricular hypertrophy.  ?Left ventricular diastolic  parameters  ?are indeterminate.  ? 2. Right ventricular systolic function is normal. The right ventricular  ?size is mildly enlarged.  ? 3. The mitral valve is normal in structure. Trivial mitral valve  ?regurgitation.  ? 4. The aortic valve was not well visualized. Aortic valve regurgitation  ?is mild to moderate. Aortic valve sclerosis/calcification is present,  ?without any evidence of aortic stenosis.  ? 5. Aortic dilatation noted. Aneurysm of the ascending aorta, measuring 45  ?mm. There is dilatation of the aortic root, measuring 43 mm.  ? ?Nuc 01/2021 ?Nuclear stress EF: 56%. ?The left ventricular ejection fraction is normal (55-65%). ?There was no ST segment deviation noted during stress. ?No T wave inversion was noted during stress. ?This is a low risk study. ?  ?Negative stress tests for ischemia or infarction.  Ventricule is qualitatively dilated, but with normal volumes and TID. ?Low risk study. ? ?CT calcium score 12/2020 ?EXAM: ?Coronary Calcium Score ?  ?TECHNIQUE: ?A gated, non-contrast computed tomography scan of the heart was ?performed using 3mm slice thickness. Axial images were analyzed on a ?dedicated workstation. Calcium scoring of the coronary arteries was ?performed using the Agatston method. ?  ?FINDINGS: ?Coronary arteries: Normal origins. ?  ?Coronary Calcium Score: ?  ?Left main: 271 ?  ?Left anterior descending artery: 941 ?  ?Left circumflex artery: 392 ?  ?Right coronary artery: 359 ?  ?Total: 1963 ?  ?Percentile: 92nd ?  ?Pericardium: Normal. ?  ?Ascending Aorta: 4.5 cm, moderate dilation. ?  ?Non-cardiac: See separate report from [facility]. ?  ?IMPRESSION: ?Coronary calcium score of 1963 Agatston units. This was 92nd ?  percentile for age-, race-, and sex-matched controls. ? ?IMPRESSION: ?1.  Aortic Atherosclerosis (ICD10-I70.0). ?2. Mild aneurysmal dilatation of the ascending thoracic aorta (4.5 ?cm in diameter). Ascending thoracic aortic aneurysm. Recommend ?semi-annual  imaging followup by CTA or MRA and referral to ?cardiothoracic surgery if not already obtained. This recommendation ?follows 2010 ACCF/AHA/AATS/ACR/ASA/SCA/SCAI/SIR/STS/SVM Guidelines ?for the Diagnosis and Management of Patients With Thoracic Aortic ?Disease. Circulation. 2010; 121: R518-A416. Aortic aneurysm NOS ?(ICD10-I71.9). ?3. Cholelithiasis. ?4. Colonic diverticulosis. ?  ?Electronically Signed: ?By: Trudie Reed M.D. ?On: 12/23/2020 09:19  ? ? ?Past Medical History:  ?Diagnosis Date  ? Agatston coronary artery calcium score greater than 400   ? Coronary Ca score 1963>>in all 3 coronary arteries and LM>>Lexiscan myoview 12/2020 showed no ischemia  ? Allergy to sulfa drugs 11/08/2021  ? Aortic atherosclerosis (HCC)   ? Aortic insufficiency   ? Arthritis   ? Dilated aortic root (HCC)   ? 54mm by echo 11/2020 and 15mm by Chest CTA 11/2020  ? HLD (hyperlipidemia)   ? LDL goal < 70  ? Hyperkalemia 11/08/2021  ? Hypertension   ? Orthostatic hypotension   ? Sleep apnea   ? states this is no longer a problem since weight loss  ? Thoracic aortic aneurysm (HCC)   ? 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 Wegener's granulomatosis (granulomatosis with polyangiitis) 1997  ? Open lung biopsy. Prednisone/Cytoxan  ? ? ?Past Surgical History:  ?Procedure Laterality Date  ? APPENDECTOMY    ? EYE SURGERY    ? GASTRIC BYPASS  2006  ? HERNIA REPAIR    ? KNEE SURGERY    ? LUNG BIOPSY  1997  ? TOTAL KNEE ARTHROPLASTY Left 09/06/2020  ? Procedure: TOTAL KNEE ARTHROPLASTY;  Surgeon: Ollen Gross, MD;  Location: [facility];  Service: Orthopedics;  Laterality: Left;   ? ? ?Current Medications: ?Current Meds  ?Medication Sig  ? abatacept (ORENCIA) 250 MG injection Inject into the vein every 30 (thirty) days.  ? alendronate (FOSAMAX) 70 MG tablet Take 70 mg by mouth every Wednesday. Take with a full glass of water on an empty stomach.  ? allopurinol (ZYLOPRIM) 100 MG tablet Take 200 mg by mouth daily.  ?  diphenhydramine-acetaminophen (TYLENOL PM) 25-500 MG TABS Take 1 tablet by mouth at bedtime as needed (pain).  ? doxazosin (CARDURA) 8 MG tablet Take 8 mg by mouth daily.  ? fish oil-omega-3 fatty acids 1000 MG capsule Take 2 g by mouth daily.   ? folic acid (FOLVITE) 1 MG tablet Take by mouth.  ? losartan (COZAAR) 25 MG tablet 1 tablet Orally Once every morning  ? methotrexate (50 MG/ML) 1 g injection Inject into the vein every Saturday. 0.6 mL  ? Multiple Vitamin (MULTIVITAMIN) tablet Take 2 tablets by mouth daily.  ? rosuvastatin (CRESTOR) 10 MG tablet Take 1 tablet (10 mg total) by mouth daily.  ? sulfamethoxazole-trimethoprim (BACTRIM) 400-80 MG tablet Take 1 tablet by mouth 2 (two) times daily.  ? vitamin B-12 (CYANOCOBALAMIN) 1000 MCG tablet Take 1,000 mcg by mouth daily.  ? ?  ? ?Allergies:   Patient has no known allergies.  ? ?Social History  ? ?Socioeconomic History  ? Marital status: Married  ?  Spouse name: Not on file  ? Number of children: 2  ? Years of education: Not on file  ? Highest education level: Not on file  ?Occupational History  ? Occupation: Runner, broadcasting/film/video  ?  Employer: [employer]  ?Tobacco Use  ? Smoking status: Never  ?  Smokeless tobacco: Never  ?Vaping Use  ? Vaping Use: Never used  ?Substance and Sexual Activity  ? Alcohol use: Yes  ?  Alcohol/week: 2.0 standard drinks  ?  Types: 2 Glasses of wine per week  ?  Comment: occas.  ? Drug use: No  ? Sexual activity: Not on file  ?Other Topics Concern  ? Not on file  ?Social History Narrative  ? Not on file  ? ?Social Determinants of Health  ? ?Financial Resource Strain: Not on file  ?Food Insecurity: Not on file  ?Transportation Needs: Not on file  ?Physical Activity: Not on file  ?Stress: Not on file  ?Social Connections: Not on file  ?  ? ?Family History:  ?The patient's family history includes Cancer in his father. ? ?ROS:   ?Please see the history of present illness.  ?All other systems are reviewed and otherwise negative.   ? ? ?EKG(s)/Additional Labs  ? ?EKG:  EKG is ordered today, personally reviewed, demonstrating sinus bradycardia 56bpm, first degree AVB, no acute STT changes ? ?Recent Labs: ?02/22/2021: ALT 23 ?12/13/2021: BUN 21; Creat 1.09; Potassium 4.5; S

## 2021-12-29 ENCOUNTER — Telehealth: Payer: Self-pay | Admitting: Physician Assistant

## 2021-12-29 ENCOUNTER — Ambulatory Visit: Payer: Medicare PPO | Admitting: Physician Assistant

## 2021-12-29 ENCOUNTER — Encounter: Payer: Self-pay | Admitting: Physician Assistant

## 2021-12-29 VITALS — BP 118/70 | HR 56 | Ht 67.0 in | Wt 198.0 lb

## 2021-12-29 DIAGNOSIS — M25562 Pain in left knee: Secondary | ICD-10-CM | POA: Diagnosis not present

## 2021-12-29 DIAGNOSIS — I10 Essential (primary) hypertension: Secondary | ICD-10-CM

## 2021-12-29 DIAGNOSIS — I712 Thoracic aortic aneurysm, without rupture, unspecified: Secondary | ICD-10-CM | POA: Diagnosis not present

## 2021-12-29 DIAGNOSIS — M1A09X0 Idiopathic chronic gout, multiple sites, without tophus (tophi): Secondary | ICD-10-CM | POA: Diagnosis not present

## 2021-12-29 DIAGNOSIS — I2584 Coronary atherosclerosis due to calcified coronary lesion: Secondary | ICD-10-CM

## 2021-12-29 DIAGNOSIS — R7989 Other specified abnormal findings of blood chemistry: Secondary | ICD-10-CM | POA: Diagnosis not present

## 2021-12-29 DIAGNOSIS — R001 Bradycardia, unspecified: Secondary | ICD-10-CM | POA: Diagnosis not present

## 2021-12-29 DIAGNOSIS — E785 Hyperlipidemia, unspecified: Secondary | ICD-10-CM | POA: Diagnosis not present

## 2021-12-29 DIAGNOSIS — B5800 Toxoplasma oculopathy, unspecified: Secondary | ICD-10-CM | POA: Diagnosis not present

## 2021-12-29 DIAGNOSIS — M79642 Pain in left hand: Secondary | ICD-10-CM | POA: Diagnosis not present

## 2021-12-29 DIAGNOSIS — M7989 Other specified soft tissue disorders: Secondary | ICD-10-CM | POA: Diagnosis not present

## 2021-12-29 DIAGNOSIS — I440 Atrioventricular block, first degree: Secondary | ICD-10-CM | POA: Diagnosis not present

## 2021-12-29 DIAGNOSIS — M1991 Primary osteoarthritis, unspecified site: Secondary | ICD-10-CM | POA: Diagnosis not present

## 2021-12-29 DIAGNOSIS — M3130 Wegener's granulomatosis without renal involvement: Secondary | ICD-10-CM | POA: Diagnosis not present

## 2021-12-29 DIAGNOSIS — M0609 Rheumatoid arthritis without rheumatoid factor, multiple sites: Secondary | ICD-10-CM | POA: Diagnosis not present

## 2021-12-29 DIAGNOSIS — I2510 Atherosclerotic heart disease of native coronary artery without angina pectoris: Secondary | ICD-10-CM

## 2021-12-29 MED ORDER — LOSARTAN POTASSIUM 25 MG PO TABS: 25.0000 mg | ORAL_TABLET | Freq: Every day | ORAL | 3 refills | Status: DC

## 2021-12-29 NOTE — Patient Instructions (Addendum)
Medication Instructions:  ?Your physician recommends that you continue on your current medications as directed. Please refer to the Current Medication list given to you today. ?*If you need a refill on your cardiac medications before your next appointment, please call your pharmacy* ? ? ?Lab Work: ?None Ordered ? ? ?Testing/Procedures: ?Your physician has requested that you have an echocardiogram. Echocardiography is a painless test that uses sound waves to create images of your heart. It provides your doctor with information about the size and shape of your heart and how well your heart?s chambers and valves are working. This procedure takes approximately one hour. There are no restrictions for this procedure. ? ? ?Follow-Up: ?At [facility], you and your health needs are our priority.  As part of our continuing mission to provide you with exceptional heart care, we have created designated Provider Care Teams.  These Care Teams include your primary Cardiologist (physician) and Advanced Practice Providers (APPs -  Physician Assistants and Nurse Practitioners) who all work together to provide you with the care you need, when you need it. ? ?We recommend signing up for the patient portal called "MyChart".  Sign up information is provided on this After Visit Summary.  MyChart is used to connect with patients for Virtual Visits (Telemedicine).  Patients are able to view lab/test results, encounter notes, upcoming appointments, etc.  Non-urgent messages can be sent to your provider as well.   ?To learn more about what you can do with MyChart, go to [email or web address].   ? ?Your next appointment:   ?1 year(s) ? ?The format for your next appointment:   ?In Person ? ?Provider:   ?Armanda Magic, MD   ? ? ?Other Instructions ? ?Information About Your Aneurysm ?One of your tests has shown an aneurysm of your thoracic aorta. The word "aneurysm" refers to a bulge in an artery (blood vessel). Most people think of them in  the context of an emergency, but yours was found incidentally. At this point there is nothing you need to do from a procedure standpoint, but there are some important things to keep in mind for day-to-day life. ? ?Mainstays of therapy for aneurysms include very good blood pressure control, healthy lifestyle, and avoiding tobacco products and street drugs. Research has raised concern that antibiotics in the fluoroquinolone class could be associated with increased risk of having an aneurysm develop or tear. This includes medicines that end in "floxacin," like Cipro or Levaquin. Make sure to discuss this information with other healthcare providers if you require antibiotics. ? ?Since aneurysms can run in families, you should discuss your diagnosis with first degree relatives as they may need to be screened for this. Regular mild-moderate physical exercise is important, but avoid heavy lifting/weight lifting over 30lbs, chopping wood, shoveling snow or digging heavy earth with a shovel. It is best to avoid activities that cause grunting or straining (medically referred to as a "Valsalva maneuver"). This happens when a person bears down against a closed throat to increase the strength of arm or abdominal muscles. There's often a tendency to do this when lifting heavy weights, doing sit-ups, push-ups or chin-ups, etc., but it may be harmful. ? ?This is a finding I would expect to be monitored periodically by your cardiology team. Most unruptured thoracic aortic aneurysms cause no symptoms, so they are often found during exams for other conditions. Contact a health care provider if you develop any discomfort in your upper back, neck, abdomen, trouble swallowing, cough or hoarseness, or unexplained  weight loss. Get help right away if you develop severe pain in your upper back or abdomen that may move into your chest and arms, or any other concerning symptoms such as shortness of breath or fever ? ?Important Information  About Sugar ? ? ? ? ?  ? ? ?. ? ?

## 2021-12-29 NOTE — Telephone Encounter (Signed)
? ?  Please call patient. Let him know that after his visit I reached out to Dr. Mayford Knife to inquire whether he should be on a baby aspirin with the calcium buildup he had on his prior CT. She agrees he should start  daily - please ensure no aspirin allergy or prior issue with aspirin. If he's tolerated well in the past, please start. Thanks! ?Ronie Spies PA-C ?  ?

## 2022-01-03 MED ORDER — ASPIRIN EC 81 MG PO TBEC: 81.0000 mg | DELAYED_RELEASE_TABLET | Freq: Every day | ORAL | 3 refills | Status: AC

## 2022-01-03 NOTE — Telephone Encounter (Signed)
Patient notified directly and voiced understanding.  

## 2022-01-05 ENCOUNTER — Encounter: Payer: Self-pay | Admitting: Cardiology

## 2022-01-05 ENCOUNTER — Ambulatory Visit (HOSPITAL_COMMUNITY)
Admission: RE | Admit: 2022-01-05 | Discharge: 2022-01-05 | Disposition: A | Discharge status: Home / Self Care | Payer: Medicare PPO | Source: Ambulatory Visit | Attending: Cardiology | Admitting: Cardiology

## 2022-01-05 DIAGNOSIS — I77810 Thoracic aortic ectasia: Secondary | ICD-10-CM | POA: Diagnosis not present

## 2022-01-05 DIAGNOSIS — I351 Nonrheumatic aortic (valve) insufficiency: Secondary | ICD-10-CM | POA: Diagnosis not present

## 2022-01-05 DIAGNOSIS — I7121 Aneurysm of the ascending aorta, without rupture: Secondary | ICD-10-CM | POA: Diagnosis not present

## 2022-01-05 MED ORDER — IOHEXOL 350 MG/ML SOLN
75.0000 mL | Freq: Once | INTRAVENOUS | Status: AC | PRN
  Administered 2022-01-05: 75 mL via INTRAVENOUS

## 2022-01-19 DIAGNOSIS — E782 Mixed hyperlipidemia: Secondary | ICD-10-CM | POA: Diagnosis not present

## 2022-01-19 DIAGNOSIS — I10 Essential (primary) hypertension: Secondary | ICD-10-CM | POA: Diagnosis not present

## 2022-01-19 DIAGNOSIS — N1831 Chronic kidney disease, stage 3a: Secondary | ICD-10-CM | POA: Diagnosis not present

## 2022-01-26 DIAGNOSIS — M0609 Rheumatoid arthritis without rheumatoid factor, multiple sites: Secondary | ICD-10-CM | POA: Diagnosis not present

## 2022-02-23 DIAGNOSIS — M0609 Rheumatoid arthritis without rheumatoid factor, multiple sites: Secondary | ICD-10-CM | POA: Diagnosis not present

## 2022-02-23 DIAGNOSIS — Z79899 Other long term (current) drug therapy: Secondary | ICD-10-CM | POA: Diagnosis not present

## 2022-03-27 DIAGNOSIS — M0609 Rheumatoid arthritis without rheumatoid factor, multiple sites: Secondary | ICD-10-CM | POA: Diagnosis not present

## 2022-04-12 DIAGNOSIS — H43821 Vitreomacular adhesion, right eye: Secondary | ICD-10-CM | POA: Diagnosis not present

## 2022-04-12 DIAGNOSIS — H31092 Other chorioretinal scars, left eye: Secondary | ICD-10-CM | POA: Diagnosis not present

## 2022-04-12 DIAGNOSIS — H35431 Paving stone degeneration of retina, right eye: Secondary | ICD-10-CM | POA: Diagnosis not present

## 2022-04-12 DIAGNOSIS — B5801 Toxoplasma chorioretinitis: Secondary | ICD-10-CM | POA: Diagnosis not present

## 2022-05-01 DIAGNOSIS — Z79899 Other long term (current) drug therapy: Secondary | ICD-10-CM | POA: Diagnosis not present

## 2022-05-01 DIAGNOSIS — M0609 Rheumatoid arthritis without rheumatoid factor, multiple sites: Secondary | ICD-10-CM | POA: Diagnosis not present

## 2022-05-29 DIAGNOSIS — U071 COVID-19: Secondary | ICD-10-CM | POA: Diagnosis not present

## 2022-06-06 DIAGNOSIS — M0609 Rheumatoid arthritis without rheumatoid factor, multiple sites: Secondary | ICD-10-CM | POA: Diagnosis not present

## 2022-06-06 DIAGNOSIS — M1A09X0 Idiopathic chronic gout, multiple sites, without tophus (tophi): Secondary | ICD-10-CM | POA: Diagnosis not present

## 2022-06-06 DIAGNOSIS — Z79899 Other long term (current) drug therapy: Secondary | ICD-10-CM | POA: Diagnosis not present

## 2022-06-19 DIAGNOSIS — Z125 Encounter for screening for malignant neoplasm of prostate: Secondary | ICD-10-CM | POA: Diagnosis not present

## 2022-06-26 DIAGNOSIS — R351 Nocturia: Secondary | ICD-10-CM | POA: Diagnosis not present

## 2022-06-26 DIAGNOSIS — Z87442 Personal history of urinary calculi: Secondary | ICD-10-CM | POA: Diagnosis not present

## 2022-06-26 DIAGNOSIS — N5201 Erectile dysfunction due to arterial insufficiency: Secondary | ICD-10-CM | POA: Diagnosis not present

## 2022-06-26 DIAGNOSIS — N401 Enlarged prostate with lower urinary tract symptoms: Secondary | ICD-10-CM | POA: Diagnosis not present

## 2022-06-29 DIAGNOSIS — M1A09X0 Idiopathic chronic gout, multiple sites, without tophus (tophi): Secondary | ICD-10-CM | POA: Diagnosis not present

## 2022-06-29 DIAGNOSIS — M0609 Rheumatoid arthritis without rheumatoid factor, multiple sites: Secondary | ICD-10-CM | POA: Diagnosis not present

## 2022-06-29 DIAGNOSIS — R7989 Other specified abnormal findings of blood chemistry: Secondary | ICD-10-CM | POA: Diagnosis not present

## 2022-06-29 DIAGNOSIS — Z6831 Body mass index (BMI) 31.0-31.9, adult: Secondary | ICD-10-CM | POA: Diagnosis not present

## 2022-06-29 DIAGNOSIS — M79642 Pain in left hand: Secondary | ICD-10-CM | POA: Diagnosis not present

## 2022-06-29 DIAGNOSIS — B5800 Toxoplasma oculopathy, unspecified: Secondary | ICD-10-CM | POA: Diagnosis not present

## 2022-06-29 DIAGNOSIS — M3130 Wegener's granulomatosis without renal involvement: Secondary | ICD-10-CM | POA: Diagnosis not present

## 2022-06-29 DIAGNOSIS — M25562 Pain in left knee: Secondary | ICD-10-CM | POA: Diagnosis not present

## 2022-06-29 DIAGNOSIS — M1991 Primary osteoarthritis, unspecified site: Secondary | ICD-10-CM | POA: Diagnosis not present

## 2022-07-04 DIAGNOSIS — M0609 Rheumatoid arthritis without rheumatoid factor, multiple sites: Secondary | ICD-10-CM | POA: Diagnosis not present

## 2022-07-20 DIAGNOSIS — M109 Gout, unspecified: Secondary | ICD-10-CM | POA: Diagnosis not present

## 2022-07-20 DIAGNOSIS — Z23 Encounter for immunization: Secondary | ICD-10-CM | POA: Diagnosis not present

## 2022-07-20 DIAGNOSIS — Z800 Family history of malignant neoplasm of digestive organs: Secondary | ICD-10-CM | POA: Diagnosis not present

## 2022-07-20 DIAGNOSIS — E782 Mixed hyperlipidemia: Secondary | ICD-10-CM | POA: Diagnosis not present

## 2022-07-20 DIAGNOSIS — Z6831 Body mass index (BMI) 31.0-31.9, adult: Secondary | ICD-10-CM | POA: Diagnosis not present

## 2022-07-20 DIAGNOSIS — I10 Essential (primary) hypertension: Secondary | ICD-10-CM | POA: Diagnosis not present

## 2022-07-20 DIAGNOSIS — N1831 Chronic kidney disease, stage 3a: Secondary | ICD-10-CM | POA: Diagnosis not present

## 2022-07-20 DIAGNOSIS — E559 Vitamin D deficiency, unspecified: Secondary | ICD-10-CM | POA: Diagnosis not present

## 2022-07-20 DIAGNOSIS — Z0000 Encounter for general adult medical examination without abnormal findings: Secondary | ICD-10-CM | POA: Diagnosis not present

## 2022-07-21 ENCOUNTER — Other Ambulatory Visit: Payer: Self-pay | Admitting: Family Medicine

## 2022-07-21 DIAGNOSIS — M8580 Other specified disorders of bone density and structure, unspecified site: Secondary | ICD-10-CM

## 2022-08-01 DIAGNOSIS — Z79899 Other long term (current) drug therapy: Secondary | ICD-10-CM | POA: Diagnosis not present

## 2022-08-01 DIAGNOSIS — M0609 Rheumatoid arthritis without rheumatoid factor, multiple sites: Secondary | ICD-10-CM | POA: Diagnosis not present

## 2022-08-09 DIAGNOSIS — N401 Enlarged prostate with lower urinary tract symptoms: Secondary | ICD-10-CM | POA: Diagnosis not present

## 2022-08-09 DIAGNOSIS — N281 Cyst of kidney, acquired: Secondary | ICD-10-CM | POA: Diagnosis not present

## 2022-08-09 DIAGNOSIS — N200 Calculus of kidney: Secondary | ICD-10-CM | POA: Diagnosis not present

## 2022-08-09 DIAGNOSIS — R3914 Feeling of incomplete bladder emptying: Secondary | ICD-10-CM | POA: Diagnosis not present

## 2022-08-14 ENCOUNTER — Other Ambulatory Visit: Payer: Self-pay

## 2022-08-14 MED ORDER — SULFAMETHOXAZOLE-TRIMETHOPRIM 400-80 MG PO TABS
1.0000 | ORAL_TABLET | Freq: Two times a day (BID) | ORAL | 3 refills | Status: DC
Start: 2022-08-14 — End: 2023-08-14

## 2022-08-30 DIAGNOSIS — M0609 Rheumatoid arthritis without rheumatoid factor, multiple sites: Secondary | ICD-10-CM | POA: Diagnosis not present

## 2022-09-27 DIAGNOSIS — R5383 Other fatigue: Secondary | ICD-10-CM | POA: Diagnosis not present

## 2022-09-27 DIAGNOSIS — Z111 Encounter for screening for respiratory tuberculosis: Secondary | ICD-10-CM | POA: Diagnosis not present

## 2022-09-27 DIAGNOSIS — M0609 Rheumatoid arthritis without rheumatoid factor, multiple sites: Secondary | ICD-10-CM | POA: Diagnosis not present

## 2022-09-27 DIAGNOSIS — Z79899 Other long term (current) drug therapy: Secondary | ICD-10-CM | POA: Diagnosis not present

## 2022-10-11 DIAGNOSIS — H26499 Other secondary cataract, unspecified eye: Secondary | ICD-10-CM | POA: Diagnosis not present

## 2022-10-11 DIAGNOSIS — B5801 Toxoplasma chorioretinitis: Secondary | ICD-10-CM | POA: Diagnosis not present

## 2022-10-11 DIAGNOSIS — H43821 Vitreomacular adhesion, right eye: Secondary | ICD-10-CM | POA: Diagnosis not present

## 2022-10-11 DIAGNOSIS — H2511 Age-related nuclear cataract, right eye: Secondary | ICD-10-CM | POA: Diagnosis not present

## 2022-10-11 DIAGNOSIS — H31092 Other chorioretinal scars, left eye: Secondary | ICD-10-CM | POA: Diagnosis not present

## 2022-10-25 DIAGNOSIS — M0609 Rheumatoid arthritis without rheumatoid factor, multiple sites: Secondary | ICD-10-CM | POA: Diagnosis not present

## 2022-11-22 DIAGNOSIS — Z79899 Other long term (current) drug therapy: Secondary | ICD-10-CM | POA: Diagnosis not present

## 2022-11-22 DIAGNOSIS — M0609 Rheumatoid arthritis without rheumatoid factor, multiple sites: Secondary | ICD-10-CM | POA: Diagnosis not present

## 2022-12-19 ENCOUNTER — Telehealth: Payer: Self-pay | Admitting: Physician Assistant

## 2022-12-19 ENCOUNTER — Ambulatory Visit (HOSPITAL_COMMUNITY): Payer: Medicare PPO | Attending: Internal Medicine

## 2022-12-19 DIAGNOSIS — I712 Thoracic aortic aneurysm, without rupture, unspecified: Secondary | ICD-10-CM | POA: Diagnosis not present

## 2022-12-19 DIAGNOSIS — I2584 Coronary atherosclerosis due to calcified coronary lesion: Secondary | ICD-10-CM | POA: Insufficient documentation

## 2022-12-19 DIAGNOSIS — E785 Hyperlipidemia, unspecified: Secondary | ICD-10-CM | POA: Diagnosis not present

## 2022-12-19 DIAGNOSIS — Z01812 Encounter for preprocedural laboratory examination: Secondary | ICD-10-CM

## 2022-12-19 DIAGNOSIS — I10 Essential (primary) hypertension: Secondary | ICD-10-CM | POA: Diagnosis not present

## 2022-12-19 DIAGNOSIS — I2510 Atherosclerotic heart disease of native coronary artery without angina pectoris: Secondary | ICD-10-CM

## 2022-12-19 DIAGNOSIS — I440 Atrioventricular block, first degree: Secondary | ICD-10-CM | POA: Diagnosis not present

## 2022-12-19 DIAGNOSIS — R001 Bradycardia, unspecified: Secondary | ICD-10-CM

## 2022-12-19 LAB — ECHOCARDIOGRAM COMPLETE
Area-P 1/2: 2.04 cm2
P 1/2 time: 557 msec
S' Lateral: 3.3 cm

## 2022-12-19 NOTE — Telephone Encounter (Signed)
Spoke with patient and discussed echo results.  Per Dayna Dunn, PA-Ronie Spiesse let pt know echo overall looks OK. Done to follow up the aorta and aortic valve. Aortic valve leaking remains mild-moderate. The ascending aorta was not well seen. Aortic root is borderline dilated. By CT 01/2022 Dr. Mayford Knife had recommended a repeat CT Angio Chest Aorta W W/O Contrast to f/u ascending thoracic aneurysm. I think since we can't definitively see this well on the echo I would go ahead and order, needs BMET beforehand. His EF is slightly down from prior with question of a change in the wall motion. He is due for yearly follow-up so would have him schedule appointment in clinic to review if any further testing needed for this.   CT angio chest aorta ordered, scheduler to call and schedule appt date/time with patient.   BMET ordered, will need lab appt scheduled prior to CT scan.  F/U appt with Dr. Mayford Knife scheduled for next available on 02/28/23. Placed patient on wait list for sooner appt if one becomes available.  Patient verbalized understanding of the above and expressed appreciation for call.

## 2022-12-20 DIAGNOSIS — M0609 Rheumatoid arthritis without rheumatoid factor, multiple sites: Secondary | ICD-10-CM | POA: Diagnosis not present

## 2023-01-01 DIAGNOSIS — M25562 Pain in left knee: Secondary | ICD-10-CM | POA: Diagnosis not present

## 2023-01-01 DIAGNOSIS — I10 Essential (primary) hypertension: Secondary | ICD-10-CM | POA: Diagnosis not present

## 2023-01-01 DIAGNOSIS — Z6831 Body mass index (BMI) 31.0-31.9, adult: Secondary | ICD-10-CM | POA: Diagnosis not present

## 2023-01-01 DIAGNOSIS — M0609 Rheumatoid arthritis without rheumatoid factor, multiple sites: Secondary | ICD-10-CM | POA: Diagnosis not present

## 2023-01-01 DIAGNOSIS — M1A09X0 Idiopathic chronic gout, multiple sites, without tophus (tophi): Secondary | ICD-10-CM | POA: Diagnosis not present

## 2023-01-01 DIAGNOSIS — S51812A Laceration without foreign body of left forearm, initial encounter: Secondary | ICD-10-CM | POA: Diagnosis not present

## 2023-01-01 DIAGNOSIS — M1991 Primary osteoarthritis, unspecified site: Secondary | ICD-10-CM | POA: Diagnosis not present

## 2023-01-01 DIAGNOSIS — B5800 Toxoplasma oculopathy, unspecified: Secondary | ICD-10-CM | POA: Diagnosis not present

## 2023-01-01 DIAGNOSIS — M3130 Wegener's granulomatosis without renal involvement: Secondary | ICD-10-CM | POA: Diagnosis not present

## 2023-01-01 DIAGNOSIS — I2510 Atherosclerotic heart disease of native coronary artery without angina pectoris: Secondary | ICD-10-CM | POA: Diagnosis not present

## 2023-01-01 DIAGNOSIS — R7989 Other specified abnormal findings of blood chemistry: Secondary | ICD-10-CM | POA: Diagnosis not present

## 2023-01-01 DIAGNOSIS — M79642 Pain in left hand: Secondary | ICD-10-CM | POA: Diagnosis not present

## 2023-01-10 DIAGNOSIS — X58XXXA Exposure to other specified factors, initial encounter: Secondary | ICD-10-CM | POA: Diagnosis not present

## 2023-01-10 DIAGNOSIS — S51812A Laceration without foreign body of left forearm, initial encounter: Secondary | ICD-10-CM | POA: Diagnosis not present

## 2023-01-11 ENCOUNTER — Ambulatory Visit
Admission: RE | Admit: 2023-01-11 | Discharge: 2023-01-11 | Disposition: A | Discharge status: Home / Self Care | Payer: Medicare PPO | Source: Ambulatory Visit | Attending: Family Medicine | Admitting: Family Medicine

## 2023-01-11 DIAGNOSIS — M8580 Other specified disorders of bone density and structure, unspecified site: Secondary | ICD-10-CM

## 2023-01-11 DIAGNOSIS — Z7952 Long term (current) use of systemic steroids: Secondary | ICD-10-CM | POA: Diagnosis not present

## 2023-01-14 DIAGNOSIS — Z4802 Encounter for removal of sutures: Secondary | ICD-10-CM | POA: Diagnosis not present

## 2023-01-17 DIAGNOSIS — M0609 Rheumatoid arthritis without rheumatoid factor, multiple sites: Secondary | ICD-10-CM | POA: Diagnosis not present

## 2023-01-18 DIAGNOSIS — M069 Rheumatoid arthritis, unspecified: Secondary | ICD-10-CM | POA: Diagnosis not present

## 2023-01-18 DIAGNOSIS — E782 Mixed hyperlipidemia: Secondary | ICD-10-CM | POA: Diagnosis not present

## 2023-01-18 DIAGNOSIS — D84821 Immunodeficiency due to drugs: Secondary | ICD-10-CM | POA: Diagnosis not present

## 2023-01-18 DIAGNOSIS — N1831 Chronic kidney disease, stage 3a: Secondary | ICD-10-CM | POA: Diagnosis not present

## 2023-01-18 DIAGNOSIS — I10 Essential (primary) hypertension: Secondary | ICD-10-CM | POA: Diagnosis not present

## 2023-01-18 DIAGNOSIS — I7121 Aneurysm of the ascending aorta, without rupture: Secondary | ICD-10-CM | POA: Diagnosis not present

## 2023-01-18 DIAGNOSIS — Z79631 Long term (current) use of antimetabolite agent: Secondary | ICD-10-CM | POA: Diagnosis not present

## 2023-01-18 DIAGNOSIS — J439 Emphysema, unspecified: Secondary | ICD-10-CM | POA: Diagnosis not present

## 2023-01-19 ENCOUNTER — Other Ambulatory Visit: Payer: Self-pay | Admitting: Physician Assistant

## 2023-02-05 DIAGNOSIS — M9902 Segmental and somatic dysfunction of thoracic region: Secondary | ICD-10-CM | POA: Diagnosis not present

## 2023-02-05 DIAGNOSIS — M4724 Other spondylosis with radiculopathy, thoracic region: Secondary | ICD-10-CM | POA: Diagnosis not present

## 2023-02-14 DIAGNOSIS — M0609 Rheumatoid arthritis without rheumatoid factor, multiple sites: Secondary | ICD-10-CM | POA: Diagnosis not present

## 2023-02-14 DIAGNOSIS — R5383 Other fatigue: Secondary | ICD-10-CM | POA: Diagnosis not present

## 2023-02-14 DIAGNOSIS — Z79899 Other long term (current) drug therapy: Secondary | ICD-10-CM | POA: Diagnosis not present

## 2023-02-19 DIAGNOSIS — M9902 Segmental and somatic dysfunction of thoracic region: Secondary | ICD-10-CM | POA: Diagnosis not present

## 2023-02-19 DIAGNOSIS — M4724 Other spondylosis with radiculopathy, thoracic region: Secondary | ICD-10-CM | POA: Diagnosis not present

## 2023-02-28 ENCOUNTER — Encounter: Payer: Self-pay | Admitting: Cardiology

## 2023-02-28 ENCOUNTER — Ambulatory Visit: Payer: Medicare PPO | Attending: Cardiology | Admitting: Cardiology

## 2023-02-28 VITALS — BP 116/70 | HR 77 | Ht 67.5 in | Wt 196.2 lb

## 2023-02-28 DIAGNOSIS — Z79899 Other long term (current) drug therapy: Secondary | ICD-10-CM

## 2023-02-28 DIAGNOSIS — I77810 Thoracic aortic ectasia: Secondary | ICD-10-CM

## 2023-02-28 DIAGNOSIS — I440 Atrioventricular block, first degree: Secondary | ICD-10-CM

## 2023-02-28 DIAGNOSIS — I2510 Atherosclerotic heart disease of native coronary artery without angina pectoris: Secondary | ICD-10-CM | POA: Diagnosis not present

## 2023-02-28 DIAGNOSIS — E785 Hyperlipidemia, unspecified: Secondary | ICD-10-CM

## 2023-02-28 DIAGNOSIS — I10 Essential (primary) hypertension: Secondary | ICD-10-CM

## 2023-02-28 DIAGNOSIS — R42 Dizziness and giddiness: Secondary | ICD-10-CM

## 2023-02-28 DIAGNOSIS — I351 Nonrheumatic aortic (valve) insufficiency: Secondary | ICD-10-CM | POA: Diagnosis not present

## 2023-02-28 DIAGNOSIS — I2584 Coronary atherosclerosis due to calcified coronary lesion: Secondary | ICD-10-CM

## 2023-02-28 DIAGNOSIS — I951 Orthostatic hypotension: Secondary | ICD-10-CM

## 2023-02-28 NOTE — Patient Instructions (Addendum)
Medication Instructions:  Your physician recommends that you continue on your current medications as directed. Please refer to the Current Medication list given to you today.   *If you need a refill on your cardiac medications before your next appointment, please call your pharmacy*   Lab Work: Please schedule a day to complete a FASTING lipid panel and ALT in our lab in November of 2024.  If you have labs (blood work) drawn today and your tests are completely normal, you will receive your results only by: MyChart Message (if you have MyChart) OR A paper copy in the mail If you have any lab test that is abnormal or we need to change your treatment, we will call you to review the results.   Testing/Procedures: Your physician has requested that you have an echocardiogram in April 2025. Echocardiography is a painless test that uses sound waves to create images of your heart. It provides your doctor with information about the size and shape of your heart and how well your heart's chambers and valves are working. This procedure takes approximately one hour. There are no restrictions for this procedure. Please do NOT wear cologne, perfume, aftershave, or lotions (deodorant is allowed). Please arrive 15 minutes prior to your appointment time.    Follow-Up: At [facility], you and your health needs are our priority.  As part of our continuing mission to provide you with exceptional heart care, we have created designated Provider Care Teams.  These Care Teams include your primary Cardiologist (physician) and Advanced Practice Providers (APPs -  Physician Assistants and Nurse Practitioners) who all work together to provide you with the care you need, when you need it.  We recommend signing up for the patient portal called "MyChart".  Sign up information is provided on this After Visit Summary.  MyChart is used to connect with patients for Virtual Visits (Telemedicine).  Patients are able to  view lab/test results, encounter notes, upcoming appointments, etc.  Non-urgent messages can be sent to your provider as well.   To learn more about what you can do with MyChart, go to [email or web address].    Your next appointment:   1 year(s)  Provider:   Armanda Magic, MD

## 2023-02-28 NOTE — Progress Notes (Signed)
Date:  02/28/2023   ID:  [name], DOB 1951/02/27, MRN [id number]  PCP:  Soundra Pilon, FNP  Cardiologist:  NEW Electrophysiologist:  None   Chief Complaint:  Orthostatic Hypotension and syncope  History of Present Illness:    Jacob Mayo is a 72 y.o. male with a hx of OSA not on CPAP and HTN. He recently had a TKR on 09/26/2020.  He did well with surgery but the first post op day his BP started to decrease.  He had problems with dizziness and was taking narcotics and Robaxin and his BP was noted to drop to 83/29mmHg when ambulating.  Prior to knee surgery he was on doxazosin 8mg  qhs and Losartan 25mg  daily and was discharged on these meds.   He started outpt PT and he started having problems with low BPs to the point he could not participate in PT. When BP started to drop during PT he was instructed to stop the losartan and doxazosin but then BP went up in PT and he restarted the Doxazosin.    It was felt that his orthostasis was accentuated by his cardura, gabapentin and pain meds.  His Gabapentin was stopped and he weaned off narcotics.  He was encouraged to drink at least 64oz of fluids daily.  He was given a Rx for compression hose. 2D echo showed normal LVF and mild AR and mildly dilated aortic root at 43mm.  He tried to go off Doxazosin and could not do it due to recurrent urinary retention .  He is here today for followup and is doing well.  He denies any chest pain or pressure, SOB, DOE, PND, orthopnea, LE edema,  palpitations.   Occasionally he will have a dizzy spell after he has been bending over and then stands up too fast but no syncope.  He is compliant with his meds and is tolerating meds with no SE.    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 Prednisone/Cytoxan   Past Surgical History:  Procedure Laterality Date   APPENDECTOMY     EYE SURGERY     GASTRIC BYPASS  2006   HERNIA REPAIR     KNEE SURGERY     LUNG BIOPSY  1997   TOTAL KNEE ARTHROPLASTY Left 09/06/2020   Procedure: TOTAL KNEE ARTHROPLASTY;  Surgeon: Ollen Gross, MD;  Location: [facility];  Service: Orthopedics;  Laterality: Left;      Current Meds  Medication Sig   abatacept (ORENCIA) 250 MG injection Inject into the vein every 30 (thirty) days.   alendronate (FOSAMAX) 70 MG tablet Take 70 mg by mouth every Wednesday. Take with a full glass of water on an empty stomach.   allopurinol (ZYLOPRIM) 100  MG tablet Take 200 mg by mouth daily.   aspirin EC 81 MG tablet Take 1 tablet (81 mg total) by mouth daily. Swallow whole.   diphenhydramine-acetaminophen (TYLENOL PM) 25-500 MG TABS Take 1 tablet by mouth at bedtime as needed (pain).   doxazosin (CARDURA) 8 MG tablet Take 8 mg by mouth daily.   fish oil-omega-3 fatty acids 1000 MG capsule Take 2 g by mouth daily.    folic acid (FOLVITE) 1 MG tablet Take by mouth.   loratadine (CLARITIN) 10 MG tablet 1 tablet Orally Once a day prn   losartan (COZAAR) 25 MG tablet Take 1 tablet (25 mg total) by mouth daily. Keep appt in June for future refills.   methotrexate 50 MG/2ML injection INJECT 0.8ML UNDER THE SKIN EVERY WEEK. DISCARD VIAL 28 DAYS AFTER FIRST USE for 84   Multiple Vitamin (MULTIVITAMIN) tablet Take 2 tablets by mouth daily.   Multiple Vitamins-Minerals (MULTI FOR  HIM 50+) TABS 2 tablets Orally Once a day   rosuvastatin (CRESTOR) 5 MG tablet Take 5 mg by mouth daily.   sulfamethoxazole-trimethoprim (BACTRIM) 400-80 MG tablet Take 1 tablet by mouth 2 (two) times daily.   vitamin B-12 (CYANOCOBALAMIN) 1000 MCG tablet Take 1,000 mcg by mouth daily.     Allergies:   Ciprofloxacin   Social History   Tobacco Use   Smoking status: Never   Smokeless tobacco: Never  Vaping Use   Vaping Use: Never used  Substance Use Topics   Alcohol use: Yes    Alcohol/week: 2.0 standard drinks of alcohol    Types: 2 Glasses of wine per week    Comment: occas.   Drug use: No     Family Hx: The patient's family history includes Cancer in his father.  ROS:   Please see the history of present illness.     All other systems reviewed and are negative.   Prior CV studies:   The following studies were reviewed today:  2D echo 11/2020 IMPRESSIONS   1. Left ventricular ejection fraction, by estimation, is 55 to 60%. The  left ventricle has normal function. The left ventricle has no regional  wall motion abnormalities. There is mild left ventricular hypertrophy.  Left ventricular diastolic parameters  were normal.   2. Right ventricular systolic function is normal. The right ventricular  size is normal. Tricuspid regurgitation signal is inadequate for assessing  PA pressure.   3. The mitral valve is normal in structure. No evidence of mitral valve  regurgitation.   4. The aortic valve was not well visualized. Aortic valve regurgitation  is mild. No aortic stenosis is present.   5. Aortic dilatation noted. There is dilatation of the aortic root,  measuring 43 mm.   Coronary Ca score 12/23/2020 FINDINGS: Coronary arteries: Normal origins.   Coronary Calcium Score:   Left main: 271   Left anterior descending artery: 941   Left circumflex artery: 392   Right coronary artery: 359   Total: 1963   Percentile: 92nd   Pericardium: Normal.   Ascending  Aorta: 4.5 cm, moderate dilation.   Non-cardiac: See separate report from [facility].   IMPRESSION: Coronary calcium score of 1963 Agatston units. This was 92nd percentile for age-, race-, and sex-matched controls.  Jacob Mayo 01/2021 Study Highlights    Nuclear stress EF: 56%. The left ventricular ejection fraction is normal (55-65%). There was no ST segment deviation noted during stress. No T wave inversion was noted during stress. This is a low risk  study.   Negative stress tests for ischemia or infarction.  Ventricule is qualitatively dilated, but with normal volumes and TID. Low risk study.     Labs/Other Tests and Data Reviewed:   EKG Interpretation  Date/Time:  Wednesday February 28 2023 13:51:33 EDT Ventricular Rate:  88 PR Interval:  182 QRS Duration: 86 QT Interval:  360 QTC Calculation: 435 R Axis:   44 Text Interpretation: Normal sinus rhythm Normal ECG When compared with ECG of 25-Aug-2020 10:02, No significant change was found Confirmed by Armanda Magic ([id number]) on 02/28/2023 2:01:18 PM    Recent Labs: No results found for requested labs within last 365 days.   Recent Lipid Panel Lab Results  Component Value Date/Time   CHOL 112 02/22/2021 09:46 AM   TRIG 76 02/22/2021 09:46 AM   HDL 36 (L) 02/22/2021 09:46 AM   CHOLHDL 3.1 02/22/2021 09:46 AM   LDLCALC 60 02/22/2021 09:46 AM    Wt Readings from Last 3 Encounters:  02/28/23 196 lb 3.2 oz (89 kg)  12/29/21 198 lb (89.8 kg)  12/13/21 196 lb (88.9 kg)     Risk Assessment/Calculations:      Objective:    No data found.  Vital Signs:  BP 116/70   Pulse 77   Ht 5' 7.5" (1.715 m)   Wt 196 lb 3.2 oz (89 kg)   SpO2 94%   BMI 30.28 kg/m   GEN: Well nourished, well developed in no acute distress HEENT: Normal NECK: No JVD; No carotid bruits LYMPHATICS: No lymphadenopathy CARDIAC:RRR, no rubs, gallops.  1/6 DM at RUSB to LLSB RESPIRATORY:  Clear to auscultation without rales,  wheezing or rhonchi  ABDOMEN: Soft, non-tender, non-distended MUSCULOSKELETAL:  No edema; No deformity  SKIN: Warm and dry NEUROLOGIC:  Alert and oriented x 3 PSYCHIATRIC:  Normal affect   ASSESSMENT & PLAN:    1.  Dizziness -likely related to orthostatic hypotension accentuated by pain meds and muscle relaxants from surgery -this does not sound like an arrhythmia and he had documented orthostasis in the hospital -He has not had any syncopal episodes  2.  Orthostatic Hypotension -This is been going on for years -He has been off and on Cardura but has had to stay on Cardura because of urinary retention  -he is now off gabapentin and pain meds -2D echo showed normal LVF -his dizziness has significantly improved and no presyncope or syncope -He cannot get compression hose on so does not use those  -Encouraged him to continue drinking at least 64 ounces of fluid a day and avoid caffeine   3.  HTN -BP is adequately controlled on exam today -continue to avoid peripheral vasodilators -Continue prescription management with losartan 25 mg daily with as needed refills  4.  Aortic insufficiency -mild by echo 2022 -Mild to moderate by echo 12/2022 -Repeat echo 12/2023  5.  Dilated aortic root and ascending aorta -aortic root measured at 43mm by echo 11/2020 -ascending aorta measured at 42mm by Chest CTA 11/2020 and echo 12/2022 -repeat echo in 1 year  6.  Coronary artery calcium -Coronary calcium score on 12/23/2020 was high at 1963 in all vessels including LM -Lexiscan myoview 01/2021 showed no ischemia -he was instructed to let me know if he develops exertional chest discomfort or SOB -Continue prescription drug management with Crestor 10 mg daily, fish oil and aspirin 81 mg daily with as needed refills  7.  HLD -LDL goal < 70 -I have personally reviewed and interpreted outside labs performed  by patient's PCP which showed LDL 67 and HDL 43 on 07/20/2022 -Continue prescription drug  management with Crestor 10mg  daily with PRN refils -repeat FLP and ALT in November 2024  Medication Adjustments/Labs and Tests Ordered: Current medicines are reviewed at length with the patient today.  Concerns regarding medicines are outlined above.   Tests Ordered: Orders Placed This Encounter  Procedures   EKG 12-Lead    Medication Changes: No orders of the defined types were placed in this encounter.   Follow Up:  In [place] year  Signed, Armanda Magic, MD  02/28/2023 1:59 PM     [facility]

## 2023-02-28 NOTE — Addendum Note (Signed)
Addended by: Luellen Pucker on: 02/28/2023 02:13 PM   Modules accepted: Orders

## 2023-03-05 DIAGNOSIS — M4724 Other spondylosis with radiculopathy, thoracic region: Secondary | ICD-10-CM | POA: Diagnosis not present

## 2023-03-05 DIAGNOSIS — M9902 Segmental and somatic dysfunction of thoracic region: Secondary | ICD-10-CM | POA: Diagnosis not present

## 2023-03-14 DIAGNOSIS — M0609 Rheumatoid arthritis without rheumatoid factor, multiple sites: Secondary | ICD-10-CM | POA: Diagnosis not present

## 2023-04-11 DIAGNOSIS — M0609 Rheumatoid arthritis without rheumatoid factor, multiple sites: Secondary | ICD-10-CM | POA: Diagnosis not present

## 2023-04-17 DIAGNOSIS — H43821 Vitreomacular adhesion, right eye: Secondary | ICD-10-CM | POA: Diagnosis not present

## 2023-04-17 DIAGNOSIS — H31092 Other chorioretinal scars, left eye: Secondary | ICD-10-CM | POA: Diagnosis not present

## 2023-04-17 DIAGNOSIS — H26492 Other secondary cataract, left eye: Secondary | ICD-10-CM | POA: Diagnosis not present

## 2023-04-17 DIAGNOSIS — B5801 Toxoplasma chorioretinitis: Secondary | ICD-10-CM | POA: Diagnosis not present

## 2023-04-17 DIAGNOSIS — H2511 Age-related nuclear cataract, right eye: Secondary | ICD-10-CM | POA: Diagnosis not present

## 2023-04-18 ENCOUNTER — Other Ambulatory Visit: Payer: Self-pay | Admitting: Physician Assistant

## 2023-04-23 DIAGNOSIS — M4724 Other spondylosis with radiculopathy, thoracic region: Secondary | ICD-10-CM | POA: Diagnosis not present

## 2023-04-23 DIAGNOSIS — M9902 Segmental and somatic dysfunction of thoracic region: Secondary | ICD-10-CM | POA: Diagnosis not present

## 2023-05-08 DIAGNOSIS — M4724 Other spondylosis with radiculopathy, thoracic region: Secondary | ICD-10-CM | POA: Diagnosis not present

## 2023-05-08 DIAGNOSIS — M9902 Segmental and somatic dysfunction of thoracic region: Secondary | ICD-10-CM | POA: Diagnosis not present

## 2023-05-10 DIAGNOSIS — Z79899 Other long term (current) drug therapy: Secondary | ICD-10-CM | POA: Diagnosis not present

## 2023-05-10 DIAGNOSIS — M0609 Rheumatoid arthritis without rheumatoid factor, multiple sites: Secondary | ICD-10-CM | POA: Diagnosis not present

## 2023-05-18 DIAGNOSIS — U071 COVID-19: Secondary | ICD-10-CM | POA: Diagnosis not present

## 2023-06-07 DIAGNOSIS — M0609 Rheumatoid arthritis without rheumatoid factor, multiple sites: Secondary | ICD-10-CM | POA: Diagnosis not present

## 2023-06-12 DIAGNOSIS — N200 Calculus of kidney: Secondary | ICD-10-CM | POA: Diagnosis not present

## 2023-06-12 DIAGNOSIS — R351 Nocturia: Secondary | ICD-10-CM | POA: Diagnosis not present

## 2023-06-12 DIAGNOSIS — Z125 Encounter for screening for malignant neoplasm of prostate: Secondary | ICD-10-CM | POA: Diagnosis not present

## 2023-06-12 DIAGNOSIS — N401 Enlarged prostate with lower urinary tract symptoms: Secondary | ICD-10-CM | POA: Diagnosis not present

## 2023-07-02 ENCOUNTER — Other Ambulatory Visit: Payer: Self-pay

## 2023-07-02 ENCOUNTER — Other Ambulatory Visit: Payer: Self-pay | Admitting: Physician Assistant

## 2023-07-02 ENCOUNTER — Other Ambulatory Visit: Payer: Self-pay | Admitting: Cardiology

## 2023-07-02 DIAGNOSIS — M4724 Other spondylosis with radiculopathy, thoracic region: Secondary | ICD-10-CM | POA: Diagnosis not present

## 2023-07-02 DIAGNOSIS — M9902 Segmental and somatic dysfunction of thoracic region: Secondary | ICD-10-CM | POA: Diagnosis not present

## 2023-07-02 DIAGNOSIS — E785 Hyperlipidemia, unspecified: Secondary | ICD-10-CM

## 2023-07-02 DIAGNOSIS — Z01812 Encounter for preprocedural laboratory examination: Secondary | ICD-10-CM

## 2023-07-02 DIAGNOSIS — I10 Essential (primary) hypertension: Secondary | ICD-10-CM

## 2023-07-05 DIAGNOSIS — M0609 Rheumatoid arthritis without rheumatoid factor, multiple sites: Secondary | ICD-10-CM | POA: Diagnosis not present

## 2023-07-09 DIAGNOSIS — Z01812 Encounter for preprocedural laboratory examination: Secondary | ICD-10-CM | POA: Diagnosis not present

## 2023-07-09 DIAGNOSIS — E785 Hyperlipidemia, unspecified: Secondary | ICD-10-CM | POA: Diagnosis not present

## 2023-07-09 DIAGNOSIS — I10 Essential (primary) hypertension: Secondary | ICD-10-CM | POA: Diagnosis not present

## 2023-07-09 LAB — BASIC METABOLIC PANEL
BUN/Creatinine Ratio: 20 (ref 10–24)
BUN: 22 mg/dL (ref 8–27)
CO2: 21 mmol/L (ref 20–29)
Calcium: 9 mg/dL (ref 8.6–10.2)
Chloride: 105 mmol/L (ref 96–106)
Creatinine, Ser: 1.09 mg/dL (ref 0.76–1.27)
Glucose: 75 mg/dL (ref 70–99)
Potassium: 5.2 mmol/L (ref 3.5–5.2)
Sodium: 138 mmol/L (ref 134–144)
eGFR: 71 mL/min/{1.73_m2} (ref >59)

## 2023-07-09 LAB — LIPID PANEL
Chol/HDL Ratio: 2.7 ratio (ref 0.0–5.0)
Cholesterol, Total: 130 mg/dL (ref 100–199)
HDL: 49 mg/dL (ref >39)
LDL Chol Calc (NIH): 61 mg/dL (ref 0–99)
Triglycerides: 109 mg/dL (ref 0–149)
VLDL Cholesterol Cal: 20 mg/dL (ref 5–40)

## 2023-07-09 LAB — ALT: ALT: 31 [IU]/L (ref 0–44)

## 2023-07-10 ENCOUNTER — Telehealth: Payer: Self-pay

## 2023-07-10 DIAGNOSIS — I10 Essential (primary) hypertension: Secondary | ICD-10-CM

## 2023-07-10 DIAGNOSIS — Z79899 Other long term (current) drug therapy: Secondary | ICD-10-CM

## 2023-07-10 NOTE — Telephone Encounter (Signed)
The patient has been notified of the result and verbalized understanding.  All questions (if any) were answered. Frutoso Schatz, RN 07/10/2023 10:10 AM  Patient eats yogurt for breakfast everyday otherwise no other supplements or excess foods high in potassium. Orders for repeat labs have been placed.

## 2023-07-10 NOTE — Telephone Encounter (Signed)
-----   Message from Laurann Montana sent at 07/10/2023  7:48 AM EST ----- Pre-CT BMET is stable except potassium is upper limits of normal. He is on losartan which can elevate potassium though his value last year was stable. I would suggest we keep an eye on this and recheck BMET again in 1-2 weeks to make sure not continuing to uptrend. Would recommend he review any supplements he may be taking and avoid anything with excess potassium (salt substitutes can contain extra potassium too). Avoid eating excess amounts of bananas, squash, yogurt, white beans, sweet potatoes, leafy greens, and avocados - still OK to eat in moderation but try to scale back.  Also looks like there are labs (lipid/ALT that went to Dr. Norris Cross box for her to result) - but has lab appt 11/13 for this? FYI.

## 2023-07-11 ENCOUNTER — Telehealth: Payer: Self-pay

## 2023-07-11 NOTE — Telephone Encounter (Signed)
-----   Message from Armanda Magic sent at 07/10/2023  7:40 PM EST ----- Lipids at goal continue current therapy and forward to PCP

## 2023-07-11 NOTE — Telephone Encounter (Signed)
Call to patient to discuss results, no answer, left detailed message per DPR explaining Lipids at goal continue and to current therapy. Copy forwarded to PCP.

## 2023-07-17 ENCOUNTER — Ambulatory Visit (HOSPITAL_COMMUNITY)
Admission: RE | Admit: 2023-07-17 | Discharge: 2023-07-17 | Disposition: A | Discharge status: Home / Self Care | Payer: Medicare PPO | Source: Ambulatory Visit | Attending: Physician Assistant | Admitting: Physician Assistant

## 2023-07-17 DIAGNOSIS — I2510 Atherosclerotic heart disease of native coronary artery without angina pectoris: Secondary | ICD-10-CM | POA: Diagnosis not present

## 2023-07-17 DIAGNOSIS — I712 Thoracic aortic aneurysm, without rupture, unspecified: Secondary | ICD-10-CM | POA: Insufficient documentation

## 2023-07-17 DIAGNOSIS — K8020 Calculus of gallbladder without cholecystitis without obstruction: Secondary | ICD-10-CM | POA: Diagnosis not present

## 2023-07-17 DIAGNOSIS — I719 Aortic aneurysm of unspecified site, without rupture: Secondary | ICD-10-CM | POA: Diagnosis not present

## 2023-07-17 DIAGNOSIS — J432 Centrilobular emphysema: Secondary | ICD-10-CM | POA: Diagnosis not present

## 2023-07-17 MED ORDER — IOHEXOL 350 MG/ML SOLN
100.0000 mL | Freq: Once | INTRAVENOUS | Status: AC | PRN
Start: 2023-07-17 — End: 2023-07-17
  Administered 2023-07-17: 100 mL via INTRAVENOUS

## 2023-07-17 MED ORDER — SODIUM CHLORIDE (PF) 0.9 % IJ SOLN
INTRAMUSCULAR | Status: AC
  Filled 2023-07-17: qty 50

## 2023-07-18 ENCOUNTER — Ambulatory Visit: Payer: Medicare PPO | Attending: Cardiology

## 2023-07-18 ENCOUNTER — Ambulatory Visit: Payer: Medicare PPO

## 2023-07-18 DIAGNOSIS — B5800 Toxoplasma oculopathy, unspecified: Secondary | ICD-10-CM | POA: Diagnosis not present

## 2023-07-18 DIAGNOSIS — M1A09X0 Idiopathic chronic gout, multiple sites, without tophus (tophi): Secondary | ICD-10-CM | POA: Diagnosis not present

## 2023-07-18 DIAGNOSIS — Z79899 Other long term (current) drug therapy: Secondary | ICD-10-CM

## 2023-07-18 DIAGNOSIS — R7989 Other specified abnormal findings of blood chemistry: Secondary | ICD-10-CM | POA: Diagnosis not present

## 2023-07-18 DIAGNOSIS — Z6831 Body mass index (BMI) 31.0-31.9, adult: Secondary | ICD-10-CM | POA: Diagnosis not present

## 2023-07-18 DIAGNOSIS — M3130 Wegener's granulomatosis without renal involvement: Secondary | ICD-10-CM | POA: Diagnosis not present

## 2023-07-18 DIAGNOSIS — M25562 Pain in left knee: Secondary | ICD-10-CM | POA: Diagnosis not present

## 2023-07-18 DIAGNOSIS — M79642 Pain in left hand: Secondary | ICD-10-CM | POA: Diagnosis not present

## 2023-07-18 DIAGNOSIS — I10 Essential (primary) hypertension: Secondary | ICD-10-CM

## 2023-07-18 DIAGNOSIS — E785 Hyperlipidemia, unspecified: Secondary | ICD-10-CM

## 2023-07-18 DIAGNOSIS — M1991 Primary osteoarthritis, unspecified site: Secondary | ICD-10-CM | POA: Diagnosis not present

## 2023-07-18 DIAGNOSIS — M0609 Rheumatoid arthritis without rheumatoid factor, multiple sites: Secondary | ICD-10-CM | POA: Diagnosis not present

## 2023-07-23 DIAGNOSIS — M4724 Other spondylosis with radiculopathy, thoracic region: Secondary | ICD-10-CM | POA: Diagnosis not present

## 2023-07-23 DIAGNOSIS — M9902 Segmental and somatic dysfunction of thoracic region: Secondary | ICD-10-CM | POA: Diagnosis not present

## 2023-07-26 DIAGNOSIS — I10 Essential (primary) hypertension: Secondary | ICD-10-CM | POA: Diagnosis not present

## 2023-07-26 DIAGNOSIS — M109 Gout, unspecified: Secondary | ICD-10-CM | POA: Diagnosis not present

## 2023-07-26 DIAGNOSIS — Z23 Encounter for immunization: Secondary | ICD-10-CM | POA: Diagnosis not present

## 2023-07-26 DIAGNOSIS — E782 Mixed hyperlipidemia: Secondary | ICD-10-CM | POA: Diagnosis not present

## 2023-07-26 DIAGNOSIS — Z0000 Encounter for general adult medical examination without abnormal findings: Secondary | ICD-10-CM | POA: Diagnosis not present

## 2023-07-26 DIAGNOSIS — J439 Emphysema, unspecified: Secondary | ICD-10-CM | POA: Diagnosis not present

## 2023-07-26 DIAGNOSIS — N1831 Chronic kidney disease, stage 3a: Secondary | ICD-10-CM | POA: Diagnosis not present

## 2023-07-26 DIAGNOSIS — M069 Rheumatoid arthritis, unspecified: Secondary | ICD-10-CM | POA: Diagnosis not present

## 2023-07-26 DIAGNOSIS — M8580 Other specified disorders of bone density and structure, unspecified site: Secondary | ICD-10-CM | POA: Diagnosis not present

## 2023-08-06 DIAGNOSIS — Z79899 Other long term (current) drug therapy: Secondary | ICD-10-CM | POA: Diagnosis not present

## 2023-08-06 DIAGNOSIS — M4724 Other spondylosis with radiculopathy, thoracic region: Secondary | ICD-10-CM | POA: Diagnosis not present

## 2023-08-06 DIAGNOSIS — M0609 Rheumatoid arthritis without rheumatoid factor, multiple sites: Secondary | ICD-10-CM | POA: Diagnosis not present

## 2023-08-06 DIAGNOSIS — M9902 Segmental and somatic dysfunction of thoracic region: Secondary | ICD-10-CM | POA: Diagnosis not present

## 2023-08-14 ENCOUNTER — Other Ambulatory Visit: Payer: Self-pay | Admitting: Infectious Disease

## 2023-08-20 DIAGNOSIS — M4724 Other spondylosis with radiculopathy, thoracic region: Secondary | ICD-10-CM | POA: Diagnosis not present

## 2023-08-20 DIAGNOSIS — M9902 Segmental and somatic dysfunction of thoracic region: Secondary | ICD-10-CM | POA: Diagnosis not present

## 2023-09-03 DIAGNOSIS — M0609 Rheumatoid arthritis without rheumatoid factor, multiple sites: Secondary | ICD-10-CM | POA: Diagnosis not present

## 2023-09-10 DIAGNOSIS — M4724 Other spondylosis with radiculopathy, thoracic region: Secondary | ICD-10-CM | POA: Diagnosis not present

## 2023-09-10 DIAGNOSIS — M9902 Segmental and somatic dysfunction of thoracic region: Secondary | ICD-10-CM | POA: Diagnosis not present

## 2023-09-24 DIAGNOSIS — M9902 Segmental and somatic dysfunction of thoracic region: Secondary | ICD-10-CM | POA: Diagnosis not present

## 2023-09-24 DIAGNOSIS — M4724 Other spondylosis with radiculopathy, thoracic region: Secondary | ICD-10-CM | POA: Diagnosis not present

## 2023-09-26 DIAGNOSIS — M530 Cervicocranial syndrome: Secondary | ICD-10-CM | POA: Diagnosis not present

## 2023-09-26 DIAGNOSIS — M9901 Segmental and somatic dysfunction of cervical region: Secondary | ICD-10-CM | POA: Diagnosis not present

## 2023-09-26 DIAGNOSIS — M62830 Muscle spasm of back: Secondary | ICD-10-CM | POA: Diagnosis not present

## 2023-09-26 DIAGNOSIS — M9903 Segmental and somatic dysfunction of lumbar region: Secondary | ICD-10-CM | POA: Diagnosis not present

## 2023-09-26 DIAGNOSIS — M9902 Segmental and somatic dysfunction of thoracic region: Secondary | ICD-10-CM | POA: Diagnosis not present

## 2023-09-26 DIAGNOSIS — S335XXA Sprain of ligaments of lumbar spine, initial encounter: Secondary | ICD-10-CM | POA: Diagnosis not present

## 2023-09-27 DIAGNOSIS — M62830 Muscle spasm of back: Secondary | ICD-10-CM | POA: Diagnosis not present

## 2023-09-27 DIAGNOSIS — S335XXA Sprain of ligaments of lumbar spine, initial encounter: Secondary | ICD-10-CM | POA: Diagnosis not present

## 2023-09-27 DIAGNOSIS — M9902 Segmental and somatic dysfunction of thoracic region: Secondary | ICD-10-CM | POA: Diagnosis not present

## 2023-09-27 DIAGNOSIS — M530 Cervicocranial syndrome: Secondary | ICD-10-CM | POA: Diagnosis not present

## 2023-09-27 DIAGNOSIS — M9903 Segmental and somatic dysfunction of lumbar region: Secondary | ICD-10-CM | POA: Diagnosis not present

## 2023-09-27 DIAGNOSIS — M9901 Segmental and somatic dysfunction of cervical region: Secondary | ICD-10-CM | POA: Diagnosis not present

## 2023-10-01 DIAGNOSIS — Z79899 Other long term (current) drug therapy: Secondary | ICD-10-CM | POA: Diagnosis not present

## 2023-10-01 DIAGNOSIS — Z111 Encounter for screening for respiratory tuberculosis: Secondary | ICD-10-CM | POA: Diagnosis not present

## 2023-10-01 DIAGNOSIS — M0609 Rheumatoid arthritis without rheumatoid factor, multiple sites: Secondary | ICD-10-CM | POA: Diagnosis not present

## 2023-10-01 DIAGNOSIS — R5383 Other fatigue: Secondary | ICD-10-CM | POA: Diagnosis not present

## 2023-10-02 DIAGNOSIS — M62830 Muscle spasm of back: Secondary | ICD-10-CM | POA: Diagnosis not present

## 2023-10-02 DIAGNOSIS — M9903 Segmental and somatic dysfunction of lumbar region: Secondary | ICD-10-CM | POA: Diagnosis not present

## 2023-10-02 DIAGNOSIS — M9901 Segmental and somatic dysfunction of cervical region: Secondary | ICD-10-CM | POA: Diagnosis not present

## 2023-10-02 DIAGNOSIS — M9902 Segmental and somatic dysfunction of thoracic region: Secondary | ICD-10-CM | POA: Diagnosis not present

## 2023-10-02 DIAGNOSIS — S335XXA Sprain of ligaments of lumbar spine, initial encounter: Secondary | ICD-10-CM | POA: Diagnosis not present

## 2023-10-02 DIAGNOSIS — M530 Cervicocranial syndrome: Secondary | ICD-10-CM | POA: Diagnosis not present

## 2023-10-04 DIAGNOSIS — S335XXA Sprain of ligaments of lumbar spine, initial encounter: Secondary | ICD-10-CM | POA: Diagnosis not present

## 2023-10-04 DIAGNOSIS — M530 Cervicocranial syndrome: Secondary | ICD-10-CM | POA: Diagnosis not present

## 2023-10-04 DIAGNOSIS — M9901 Segmental and somatic dysfunction of cervical region: Secondary | ICD-10-CM | POA: Diagnosis not present

## 2023-10-04 DIAGNOSIS — M9903 Segmental and somatic dysfunction of lumbar region: Secondary | ICD-10-CM | POA: Diagnosis not present

## 2023-10-04 DIAGNOSIS — M9902 Segmental and somatic dysfunction of thoracic region: Secondary | ICD-10-CM | POA: Diagnosis not present

## 2023-10-04 DIAGNOSIS — M62830 Muscle spasm of back: Secondary | ICD-10-CM | POA: Diagnosis not present

## 2023-10-05 LAB — LAB REPORT - SCANNED: HM Hepatitis Screen: NEGATIVE

## 2023-10-09 DIAGNOSIS — M530 Cervicocranial syndrome: Secondary | ICD-10-CM | POA: Diagnosis not present

## 2023-10-09 DIAGNOSIS — M9901 Segmental and somatic dysfunction of cervical region: Secondary | ICD-10-CM | POA: Diagnosis not present

## 2023-10-09 DIAGNOSIS — M9903 Segmental and somatic dysfunction of lumbar region: Secondary | ICD-10-CM | POA: Diagnosis not present

## 2023-10-09 DIAGNOSIS — M9902 Segmental and somatic dysfunction of thoracic region: Secondary | ICD-10-CM | POA: Diagnosis not present

## 2023-10-09 DIAGNOSIS — M62830 Muscle spasm of back: Secondary | ICD-10-CM | POA: Diagnosis not present

## 2023-10-09 DIAGNOSIS — S335XXA Sprain of ligaments of lumbar spine, initial encounter: Secondary | ICD-10-CM | POA: Diagnosis not present

## 2023-10-16 DIAGNOSIS — M9902 Segmental and somatic dysfunction of thoracic region: Secondary | ICD-10-CM | POA: Diagnosis not present

## 2023-10-16 DIAGNOSIS — M62830 Muscle spasm of back: Secondary | ICD-10-CM | POA: Diagnosis not present

## 2023-10-16 DIAGNOSIS — S335XXA Sprain of ligaments of lumbar spine, initial encounter: Secondary | ICD-10-CM | POA: Diagnosis not present

## 2023-10-16 DIAGNOSIS — M530 Cervicocranial syndrome: Secondary | ICD-10-CM | POA: Diagnosis not present

## 2023-10-16 DIAGNOSIS — M9903 Segmental and somatic dysfunction of lumbar region: Secondary | ICD-10-CM | POA: Diagnosis not present

## 2023-10-16 DIAGNOSIS — M9901 Segmental and somatic dysfunction of cervical region: Secondary | ICD-10-CM | POA: Diagnosis not present

## 2023-10-29 DIAGNOSIS — M0609 Rheumatoid arthritis without rheumatoid factor, multiple sites: Secondary | ICD-10-CM | POA: Diagnosis not present

## 2023-10-30 DIAGNOSIS — S335XXA Sprain of ligaments of lumbar spine, initial encounter: Secondary | ICD-10-CM | POA: Diagnosis not present

## 2023-10-30 DIAGNOSIS — M530 Cervicocranial syndrome: Secondary | ICD-10-CM | POA: Diagnosis not present

## 2023-10-30 DIAGNOSIS — M9901 Segmental and somatic dysfunction of cervical region: Secondary | ICD-10-CM | POA: Diagnosis not present

## 2023-10-30 DIAGNOSIS — M9902 Segmental and somatic dysfunction of thoracic region: Secondary | ICD-10-CM | POA: Diagnosis not present

## 2023-10-30 DIAGNOSIS — M9903 Segmental and somatic dysfunction of lumbar region: Secondary | ICD-10-CM | POA: Diagnosis not present

## 2023-10-30 DIAGNOSIS — M62830 Muscle spasm of back: Secondary | ICD-10-CM | POA: Diagnosis not present

## 2023-11-04 NOTE — Progress Notes (Unsigned)
 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   MRN: [id number]  HPI  Discussed the use of AI scribe software for clinical note transcription with the patient, who gave verbal consent to proceed.  History of Present Illness   The patient, with a history of RA on orenica, Wegener's and ocular toxoplasmosis and presents for a routine follow-up. He has been on Bactrim for the past three to four years for toxoplasmosis, which had previously recurred after discontinuation of the medication. He reports no issues with the Bactrim. He also receives monthly Orencia infusions for rheumatoid arthritis. He has not had any flare-ups of Wegener's granulomatosis, which may be due to the Orencia. He also reports a history of COVID-19 infection several months ago.      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 Prednisone/Cytoxan    Past Surgical History:  Procedure Laterality Date   APPENDECTOMY     EYE SURGERY     GASTRIC BYPASS  2006   HERNIA REPAIR     KNEE SURGERY     LUNG BIOPSY  1997   TOTAL KNEE ARTHROPLASTY Left 09/06/2020   Procedure: TOTAL KNEE ARTHROPLASTY;  Surgeon: Ollen Gross, MD;  Location: [facility];  Service: Orthopedics;   Laterality: Left;     Family History  Problem Relation Age of Onset   Cancer Father       Social History   Socioeconomic History   Marital status: Married    Spouse name: Not on file   Number of children: 2   Years of education: Not on file   Highest education level: Not on file  Occupational History   Occupation: Runner, broadcasting/film/video    Employer: [employer]  Tobacco Use   Smoking status: Never   Smokeless tobacco: Never  Vaping Use   Vaping status: Never Used  Substance and Sexual Activity   Alcohol use: Yes    Alcohol/week: 2.0 standard drinks of alcohol    Types: 2 Glasses of wine per week    Comment: occas.   Drug use: No   Sexual activity: Not on file  Other Topics Concern   Not on file  Social History Narrative   Not on file   Social Drivers of Health   Financial Resource Strain: Not on file  Food Insecurity: Not on file  Transportation Needs: Not on file  Physical Activity: Not on file  Stress: Not on file  Social Connections: Not on file    Allergies  Allergen Reactions   Ciprofloxacin     AVOID FLUOROQUINOLONE CLASS DUE TO THORACIC AORTIC ANEURYSM      Current Outpatient Medications:  abatacept (ORENCIA) 250 MG injection, Inject into the vein every 30 (thirty) days., Disp: , Rfl:    alendronate (FOSAMAX) 70 MG tablet, Take 70 mg by mouth every Wednesday. Take with a full glass of water on an empty stomach., Disp: , Rfl:    allopurinol (ZYLOPRIM) 100 MG tablet, Take 200 mg by mouth daily., Disp: , Rfl:    aspirin EC 81 MG tablet, Take 1 tablet (81 mg total) by mouth daily. Swallow whole., Disp: 90 tablet, Rfl: 3   diphenhydramine-acetaminophen (TYLENOL PM) 25-500 MG TABS, Take 1 tablet by mouth at bedtime as needed (pain)., Disp: , Rfl:    doxazosin (CARDURA) 8 MG tablet, Take 8 mg by mouth daily., Disp: , Rfl:    fish oil-omega-3 fatty acids 1000 MG capsule, Take 2 g by mouth daily. , Disp: , Rfl:    folic acid (FOLVITE) 1 MG tablet,  Take by mouth., Disp: , Rfl:    loratadine (CLARITIN) 10 MG tablet, 1 tablet Orally Once a day prn, Disp: , Rfl:    losartan (COZAAR) 25 MG tablet, Take 1 tablet (25 mg total) by mouth daily., Disp: 90 tablet, Rfl: 3   methotrexate 50 MG/2ML injection, INJECT 0.8ML UNDER THE SKIN EVERY WEEK. DISCARD VIAL 28 DAYS AFTER FIRST USE for 84, Disp: , Rfl:    Multiple Vitamin (MULTIVITAMIN) tablet, Take 2 tablets by mouth daily., Disp: , Rfl:    Multiple Vitamins-Minerals (MULTI FOR HIM 50+) TABS, 2 tablets Orally Once a day, Disp: , Rfl:    rosuvastatin (CRESTOR) 5 MG tablet, Take 5 mg by mouth daily., Disp: , Rfl:    sulfamethoxazole-trimethoprim (BACTRIM) 400-80 MG tablet, TAKE ONE TABLET BY MOUTH TWICE DAILY, Disp: 180 tablet, Rfl: 1   vitamin B-12 (CYANOCOBALAMIN) 1000 MCG tablet, Take 1,000 mcg by mouth daily., Disp: , Rfl:    Review of Systems  Constitutional:  Negative for activity change, appetite change, chills, diaphoresis, fatigue, fever and unexpected weight change.  HENT:  Negative for congestion, rhinorrhea, sinus pressure, sneezing, sore throat and trouble swallowing.   Eyes:  Negative for photophobia and visual disturbance.  Respiratory:  Negative for cough, chest tightness, shortness of breath, wheezing and stridor.   Cardiovascular:  Negative for chest pain, palpitations and leg swelling.  Gastrointestinal:  Negative for abdominal distention, abdominal pain, anal bleeding, blood in stool, constipation, diarrhea, nausea and vomiting.  Genitourinary:  Negative for difficulty urinating, dysuria, flank pain and hematuria.  Musculoskeletal:  Negative for arthralgias, back pain, gait problem, joint swelling and myalgias.  Skin:  Negative for color change, pallor, rash and wound.  Neurological:  Negative for dizziness, tremors, weakness and light-headedness.  Hematological:  Negative for adenopathy. Does not bruise/bleed easily.  Psychiatric/Behavioral:  Negative for agitation, behavioral  problems, confusion, decreased concentration, dysphoric mood and sleep disturbance.        Objective:   Physical Exam Constitutional:      Appearance: He is well-developed.  HENT:     Head: Normocephalic and atraumatic.  Eyes:     Conjunctiva/sclera: Conjunctivae normal.  Cardiovascular:     Rate and Rhythm: Normal rate and regular rhythm.  Pulmonary:     Effort: Pulmonary effort is normal. No respiratory distress.     Breath sounds: No wheezing.  Abdominal:     General: There is no distension.     Palpations: Abdomen is soft.  Musculoskeletal:        General: No tenderness. Normal range of motion.     Cervical back: Normal  range of motion and neck supple.  Skin:    General: Skin is warm and dry.     Coloration: Skin is not pale.     Findings: No erythema or rash.  Neurological:     General: No focal deficit present.     Mental Status: He is alert and oriented to person, place, and time.  Psychiatric:        Mood and Affect: Mood normal.        Behavior: Behavior normal.        Thought Content: Thought content normal.        Judgment: Judgment normal.           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 No current issues reported. -Continue Bactrim single strength twice daily. -obtain labs from Dr. Dierdre Forth who did them in late January  Rheumatoid Arthritis Stable on monthly Orencia infusions. -Continue monthly Orencia infusions.  Granulomatosis with Polyangiitis (formerly Wegener's Granulomatosis) No current flare-ups reported. -Continue current management.  COVID-19 Vaccination Last vaccine received in September/October 2024. Patient is on immunosuppressive therapy and over 76 years old, indicating a need for biannual COVID-19 vaccinations. 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  Follow-up Annual follow-up due to ongoing antibiotic prescription. -Schedule  follow-up appointment for one year.

## 2023-11-05 ENCOUNTER — Ambulatory Visit: Payer: Medicare PPO | Admitting: Infectious Disease

## 2023-11-05 ENCOUNTER — Other Ambulatory Visit: Payer: Self-pay

## 2023-11-05 VITALS — BP 129/74 | HR 60 | Ht 69.0 in | Wt 206.0 lb

## 2023-11-05 DIAGNOSIS — M3130 Wegener's granulomatosis without renal involvement: Secondary | ICD-10-CM | POA: Diagnosis not present

## 2023-11-05 DIAGNOSIS — M057A Rheumatoid arthritis with rheumatoid factor of other specified site without organ or systems involvement: Secondary | ICD-10-CM

## 2023-11-05 DIAGNOSIS — Z5181 Encounter for therapeutic drug level monitoring: Secondary | ICD-10-CM

## 2023-11-05 DIAGNOSIS — Z23 Encounter for immunization: Secondary | ICD-10-CM | POA: Diagnosis not present

## 2023-11-05 DIAGNOSIS — Z7185 Encounter for immunization safety counseling: Secondary | ICD-10-CM | POA: Diagnosis not present

## 2023-11-05 DIAGNOSIS — B5801 Toxoplasma chorioretinitis: Secondary | ICD-10-CM

## 2023-11-05 MED ORDER — SULFAMETHOXAZOLE-TRIMETHOPRIM 400-80 MG PO TABS: 1.0000 | ORAL_TABLET | Freq: Two times a day (BID) | ORAL | 3 refills | Status: AC

## 2023-11-06 ENCOUNTER — Telehealth: Payer: Self-pay

## 2023-11-06 ENCOUNTER — Other Ambulatory Visit: Payer: Self-pay

## 2023-11-06 DIAGNOSIS — Z5181 Encounter for therapeutic drug level monitoring: Secondary | ICD-10-CM

## 2023-11-06 NOTE — Telephone Encounter (Signed)
 Per Dr. Daiva Eves patient will need a BMP drawn due to him being on the Bactrim. We requested labs from rheumatology and a BMP was not drawn at the most recent visit with them. Patient has not seen pcp since Nov. I have ordered an BMP for the patient to have drawn here and he will call this week or next week to let us know when he can come by. Linda Grimmer Jacob Mayo, CMA

## 2023-11-13 DIAGNOSIS — M530 Cervicocranial syndrome: Secondary | ICD-10-CM | POA: Diagnosis not present

## 2023-11-13 DIAGNOSIS — M62830 Muscle spasm of back: Secondary | ICD-10-CM | POA: Diagnosis not present

## 2023-11-13 DIAGNOSIS — M9901 Segmental and somatic dysfunction of cervical region: Secondary | ICD-10-CM | POA: Diagnosis not present

## 2023-11-13 DIAGNOSIS — S335XXA Sprain of ligaments of lumbar spine, initial encounter: Secondary | ICD-10-CM | POA: Diagnosis not present

## 2023-11-13 DIAGNOSIS — M9903 Segmental and somatic dysfunction of lumbar region: Secondary | ICD-10-CM | POA: Diagnosis not present

## 2023-11-13 DIAGNOSIS — M9902 Segmental and somatic dysfunction of thoracic region: Secondary | ICD-10-CM | POA: Diagnosis not present

## 2023-11-26 DIAGNOSIS — Z79899 Other long term (current) drug therapy: Secondary | ICD-10-CM | POA: Diagnosis not present

## 2023-11-26 DIAGNOSIS — R5383 Other fatigue: Secondary | ICD-10-CM | POA: Diagnosis not present

## 2023-11-26 DIAGNOSIS — M0609 Rheumatoid arthritis without rheumatoid factor, multiple sites: Secondary | ICD-10-CM | POA: Diagnosis not present

## 2023-12-19 ENCOUNTER — Encounter: Payer: Self-pay | Admitting: Cardiology

## 2023-12-19 ENCOUNTER — Ambulatory Visit (HOSPITAL_COMMUNITY): Payer: Medicare PPO | Attending: Cardiology

## 2023-12-19 DIAGNOSIS — M9902 Segmental and somatic dysfunction of thoracic region: Secondary | ICD-10-CM | POA: Diagnosis not present

## 2023-12-19 DIAGNOSIS — S335XXA Sprain of ligaments of lumbar spine, initial encounter: Secondary | ICD-10-CM | POA: Diagnosis not present

## 2023-12-19 DIAGNOSIS — I517 Cardiomegaly: Secondary | ICD-10-CM | POA: Diagnosis not present

## 2023-12-19 DIAGNOSIS — I082 Rheumatic disorders of both aortic and tricuspid valves: Secondary | ICD-10-CM | POA: Diagnosis not present

## 2023-12-19 DIAGNOSIS — I77810 Thoracic aortic ectasia: Secondary | ICD-10-CM | POA: Insufficient documentation

## 2023-12-19 DIAGNOSIS — M9901 Segmental and somatic dysfunction of cervical region: Secondary | ICD-10-CM | POA: Diagnosis not present

## 2023-12-19 DIAGNOSIS — M9903 Segmental and somatic dysfunction of lumbar region: Secondary | ICD-10-CM | POA: Diagnosis not present

## 2023-12-19 DIAGNOSIS — M530 Cervicocranial syndrome: Secondary | ICD-10-CM | POA: Diagnosis not present

## 2023-12-19 DIAGNOSIS — M62830 Muscle spasm of back: Secondary | ICD-10-CM | POA: Diagnosis not present

## 2023-12-19 LAB — ECHOCARDIOGRAM COMPLETE
Area-P 1/2: 3.2 cm2
P 1/2 time: 459 ms
S' Lateral: 3.5 cm

## 2023-12-20 ENCOUNTER — Telehealth: Payer: Self-pay

## 2023-12-20 DIAGNOSIS — I77810 Thoracic aortic ectasia: Secondary | ICD-10-CM

## 2023-12-20 DIAGNOSIS — I712 Thoracic aortic aneurysm, without rupture, unspecified: Secondary | ICD-10-CM

## 2023-12-20 DIAGNOSIS — I351 Nonrheumatic aortic (valve) insufficiency: Secondary | ICD-10-CM

## 2023-12-20 NOTE — Telephone Encounter (Signed)
 The patient has been notified of the result and verbalized understanding.  All questions (if any) were answered. Lenisha Lacap Chauvigne, RN 12/20/2023 4:48 PM  Orders placed.

## 2023-12-20 NOTE — Telephone Encounter (Signed)
-----   Message from Gaylyn Keas sent at 12/19/2023  3:41 PM EDT ----- Echo showed low normal pumping function of the heart muscle with EF 50-55%. Mildly thickened heart muscle called hypertrophy, moderately enlarged LA due to HTN, moderately calcified aortic valve with mild to moderate leakiness of the AV and mildly dilated ascending aorta at 43mm - repeat echo in 1 year for AI a>>repeat Chest CTA with contrast in 1 year for aorta

## 2023-12-24 DIAGNOSIS — M0609 Rheumatoid arthritis without rheumatoid factor, multiple sites: Secondary | ICD-10-CM | POA: Diagnosis not present

## 2024-01-01 DIAGNOSIS — M62830 Muscle spasm of back: Secondary | ICD-10-CM | POA: Diagnosis not present

## 2024-01-01 DIAGNOSIS — S335XXA Sprain of ligaments of lumbar spine, initial encounter: Secondary | ICD-10-CM | POA: Diagnosis not present

## 2024-01-01 DIAGNOSIS — M9902 Segmental and somatic dysfunction of thoracic region: Secondary | ICD-10-CM | POA: Diagnosis not present

## 2024-01-01 DIAGNOSIS — M9901 Segmental and somatic dysfunction of cervical region: Secondary | ICD-10-CM | POA: Diagnosis not present

## 2024-01-01 DIAGNOSIS — M9903 Segmental and somatic dysfunction of lumbar region: Secondary | ICD-10-CM | POA: Diagnosis not present

## 2024-01-01 DIAGNOSIS — M530 Cervicocranial syndrome: Secondary | ICD-10-CM | POA: Diagnosis not present

## 2024-01-14 DIAGNOSIS — M9902 Segmental and somatic dysfunction of thoracic region: Secondary | ICD-10-CM | POA: Diagnosis not present

## 2024-01-14 DIAGNOSIS — M9901 Segmental and somatic dysfunction of cervical region: Secondary | ICD-10-CM | POA: Diagnosis not present

## 2024-01-14 DIAGNOSIS — M62830 Muscle spasm of back: Secondary | ICD-10-CM | POA: Diagnosis not present

## 2024-01-14 DIAGNOSIS — M9903 Segmental and somatic dysfunction of lumbar region: Secondary | ICD-10-CM | POA: Diagnosis not present

## 2024-01-14 DIAGNOSIS — S335XXA Sprain of ligaments of lumbar spine, initial encounter: Secondary | ICD-10-CM | POA: Diagnosis not present

## 2024-01-14 DIAGNOSIS — M530 Cervicocranial syndrome: Secondary | ICD-10-CM | POA: Diagnosis not present

## 2024-01-16 DIAGNOSIS — M0609 Rheumatoid arthritis without rheumatoid factor, multiple sites: Secondary | ICD-10-CM | POA: Diagnosis not present

## 2024-01-16 DIAGNOSIS — M3130 Wegener's granulomatosis without renal involvement: Secondary | ICD-10-CM | POA: Diagnosis not present

## 2024-01-16 DIAGNOSIS — M1A09X0 Idiopathic chronic gout, multiple sites, without tophus (tophi): Secondary | ICD-10-CM | POA: Diagnosis not present

## 2024-01-16 DIAGNOSIS — M25562 Pain in left knee: Secondary | ICD-10-CM | POA: Diagnosis not present

## 2024-01-16 DIAGNOSIS — R7989 Other specified abnormal findings of blood chemistry: Secondary | ICD-10-CM | POA: Diagnosis not present

## 2024-01-16 DIAGNOSIS — M1991 Primary osteoarthritis, unspecified site: Secondary | ICD-10-CM | POA: Diagnosis not present

## 2024-01-16 DIAGNOSIS — B5800 Toxoplasma oculopathy, unspecified: Secondary | ICD-10-CM | POA: Diagnosis not present

## 2024-01-16 DIAGNOSIS — M79642 Pain in left hand: Secondary | ICD-10-CM | POA: Diagnosis not present

## 2024-01-16 DIAGNOSIS — M8580 Other specified disorders of bone density and structure, unspecified site: Secondary | ICD-10-CM | POA: Diagnosis not present

## 2024-01-21 DIAGNOSIS — M0609 Rheumatoid arthritis without rheumatoid factor, multiple sites: Secondary | ICD-10-CM | POA: Diagnosis not present

## 2024-01-23 DIAGNOSIS — I10 Essential (primary) hypertension: Secondary | ICD-10-CM | POA: Diagnosis not present

## 2024-01-23 DIAGNOSIS — M8580 Other specified disorders of bone density and structure, unspecified site: Secondary | ICD-10-CM | POA: Diagnosis not present

## 2024-01-23 DIAGNOSIS — E782 Mixed hyperlipidemia: Secondary | ICD-10-CM | POA: Diagnosis not present

## 2024-01-29 DIAGNOSIS — M9902 Segmental and somatic dysfunction of thoracic region: Secondary | ICD-10-CM | POA: Diagnosis not present

## 2024-01-29 DIAGNOSIS — M62830 Muscle spasm of back: Secondary | ICD-10-CM | POA: Diagnosis not present

## 2024-01-29 DIAGNOSIS — M9901 Segmental and somatic dysfunction of cervical region: Secondary | ICD-10-CM | POA: Diagnosis not present

## 2024-01-29 DIAGNOSIS — S335XXA Sprain of ligaments of lumbar spine, initial encounter: Secondary | ICD-10-CM | POA: Diagnosis not present

## 2024-01-29 DIAGNOSIS — M9903 Segmental and somatic dysfunction of lumbar region: Secondary | ICD-10-CM | POA: Diagnosis not present

## 2024-01-29 DIAGNOSIS — M530 Cervicocranial syndrome: Secondary | ICD-10-CM | POA: Diagnosis not present

## 2024-02-18 DIAGNOSIS — M0609 Rheumatoid arthritis without rheumatoid factor, multiple sites: Secondary | ICD-10-CM | POA: Diagnosis not present

## 2024-02-25 DIAGNOSIS — M9902 Segmental and somatic dysfunction of thoracic region: Secondary | ICD-10-CM | POA: Diagnosis not present

## 2024-02-25 DIAGNOSIS — M62830 Muscle spasm of back: Secondary | ICD-10-CM | POA: Diagnosis not present

## 2024-02-25 DIAGNOSIS — M530 Cervicocranial syndrome: Secondary | ICD-10-CM | POA: Diagnosis not present

## 2024-02-25 DIAGNOSIS — S335XXA Sprain of ligaments of lumbar spine, initial encounter: Secondary | ICD-10-CM | POA: Diagnosis not present

## 2024-02-25 DIAGNOSIS — M9901 Segmental and somatic dysfunction of cervical region: Secondary | ICD-10-CM | POA: Diagnosis not present

## 2024-02-25 DIAGNOSIS — M9903 Segmental and somatic dysfunction of lumbar region: Secondary | ICD-10-CM | POA: Diagnosis not present

## 2024-03-10 DIAGNOSIS — M9903 Segmental and somatic dysfunction of lumbar region: Secondary | ICD-10-CM | POA: Diagnosis not present

## 2024-03-10 DIAGNOSIS — M62830 Muscle spasm of back: Secondary | ICD-10-CM | POA: Diagnosis not present

## 2024-03-10 DIAGNOSIS — S335XXA Sprain of ligaments of lumbar spine, initial encounter: Secondary | ICD-10-CM | POA: Diagnosis not present

## 2024-03-10 DIAGNOSIS — M9901 Segmental and somatic dysfunction of cervical region: Secondary | ICD-10-CM | POA: Diagnosis not present

## 2024-03-10 DIAGNOSIS — M530 Cervicocranial syndrome: Secondary | ICD-10-CM | POA: Diagnosis not present

## 2024-03-10 DIAGNOSIS — M9902 Segmental and somatic dysfunction of thoracic region: Secondary | ICD-10-CM | POA: Diagnosis not present

## 2024-03-17 DIAGNOSIS — M0609 Rheumatoid arthritis without rheumatoid factor, multiple sites: Secondary | ICD-10-CM | POA: Diagnosis not present

## 2024-03-24 DIAGNOSIS — M62830 Muscle spasm of back: Secondary | ICD-10-CM | POA: Diagnosis not present

## 2024-03-24 DIAGNOSIS — S335XXA Sprain of ligaments of lumbar spine, initial encounter: Secondary | ICD-10-CM | POA: Diagnosis not present

## 2024-03-24 DIAGNOSIS — M9903 Segmental and somatic dysfunction of lumbar region: Secondary | ICD-10-CM | POA: Diagnosis not present

## 2024-03-24 DIAGNOSIS — M9902 Segmental and somatic dysfunction of thoracic region: Secondary | ICD-10-CM | POA: Diagnosis not present

## 2024-03-24 DIAGNOSIS — M530 Cervicocranial syndrome: Secondary | ICD-10-CM | POA: Diagnosis not present

## 2024-03-24 DIAGNOSIS — M9901 Segmental and somatic dysfunction of cervical region: Secondary | ICD-10-CM | POA: Diagnosis not present

## 2024-04-07 DIAGNOSIS — S335XXA Sprain of ligaments of lumbar spine, initial encounter: Secondary | ICD-10-CM | POA: Diagnosis not present

## 2024-04-07 DIAGNOSIS — M530 Cervicocranial syndrome: Secondary | ICD-10-CM | POA: Diagnosis not present

## 2024-04-07 DIAGNOSIS — M9901 Segmental and somatic dysfunction of cervical region: Secondary | ICD-10-CM | POA: Diagnosis not present

## 2024-04-07 DIAGNOSIS — M62830 Muscle spasm of back: Secondary | ICD-10-CM | POA: Diagnosis not present

## 2024-04-07 DIAGNOSIS — M9902 Segmental and somatic dysfunction of thoracic region: Secondary | ICD-10-CM | POA: Diagnosis not present

## 2024-04-07 DIAGNOSIS — M9903 Segmental and somatic dysfunction of lumbar region: Secondary | ICD-10-CM | POA: Diagnosis not present

## 2024-04-14 ENCOUNTER — Other Ambulatory Visit: Payer: Self-pay | Admitting: Cardiology

## 2024-04-14 DIAGNOSIS — M0609 Rheumatoid arthritis without rheumatoid factor, multiple sites: Secondary | ICD-10-CM | POA: Diagnosis not present

## 2024-04-21 DIAGNOSIS — M9902 Segmental and somatic dysfunction of thoracic region: Secondary | ICD-10-CM | POA: Diagnosis not present

## 2024-04-21 DIAGNOSIS — M530 Cervicocranial syndrome: Secondary | ICD-10-CM | POA: Diagnosis not present

## 2024-04-21 DIAGNOSIS — M62830 Muscle spasm of back: Secondary | ICD-10-CM | POA: Diagnosis not present

## 2024-04-21 DIAGNOSIS — S335XXA Sprain of ligaments of lumbar spine, initial encounter: Secondary | ICD-10-CM | POA: Diagnosis not present

## 2024-04-21 DIAGNOSIS — M9903 Segmental and somatic dysfunction of lumbar region: Secondary | ICD-10-CM | POA: Diagnosis not present

## 2024-04-21 DIAGNOSIS — M9901 Segmental and somatic dysfunction of cervical region: Secondary | ICD-10-CM | POA: Diagnosis not present

## 2024-05-06 DIAGNOSIS — S335XXA Sprain of ligaments of lumbar spine, initial encounter: Secondary | ICD-10-CM | POA: Diagnosis not present

## 2024-05-06 DIAGNOSIS — M9902 Segmental and somatic dysfunction of thoracic region: Secondary | ICD-10-CM | POA: Diagnosis not present

## 2024-05-06 DIAGNOSIS — M530 Cervicocranial syndrome: Secondary | ICD-10-CM | POA: Diagnosis not present

## 2024-05-06 DIAGNOSIS — M9903 Segmental and somatic dysfunction of lumbar region: Secondary | ICD-10-CM | POA: Diagnosis not present

## 2024-05-06 DIAGNOSIS — M9901 Segmental and somatic dysfunction of cervical region: Secondary | ICD-10-CM | POA: Diagnosis not present

## 2024-05-06 DIAGNOSIS — M62830 Muscle spasm of back: Secondary | ICD-10-CM | POA: Diagnosis not present

## 2024-05-12 DIAGNOSIS — M0609 Rheumatoid arthritis without rheumatoid factor, multiple sites: Secondary | ICD-10-CM | POA: Diagnosis not present

## 2024-05-19 DIAGNOSIS — S335XXA Sprain of ligaments of lumbar spine, initial encounter: Secondary | ICD-10-CM | POA: Diagnosis not present

## 2024-05-19 DIAGNOSIS — M9903 Segmental and somatic dysfunction of lumbar region: Secondary | ICD-10-CM | POA: Diagnosis not present

## 2024-05-19 DIAGNOSIS — M9901 Segmental and somatic dysfunction of cervical region: Secondary | ICD-10-CM | POA: Diagnosis not present

## 2024-05-19 DIAGNOSIS — M9902 Segmental and somatic dysfunction of thoracic region: Secondary | ICD-10-CM | POA: Diagnosis not present

## 2024-05-19 DIAGNOSIS — M62830 Muscle spasm of back: Secondary | ICD-10-CM | POA: Diagnosis not present

## 2024-05-19 DIAGNOSIS — M530 Cervicocranial syndrome: Secondary | ICD-10-CM | POA: Diagnosis not present

## 2024-05-26 ENCOUNTER — Other Ambulatory Visit: Payer: Self-pay | Admitting: Cardiology

## 2024-05-27 DIAGNOSIS — M9903 Segmental and somatic dysfunction of lumbar region: Secondary | ICD-10-CM | POA: Diagnosis not present

## 2024-05-27 DIAGNOSIS — S335XXA Sprain of ligaments of lumbar spine, initial encounter: Secondary | ICD-10-CM | POA: Diagnosis not present

## 2024-05-27 DIAGNOSIS — M530 Cervicocranial syndrome: Secondary | ICD-10-CM | POA: Diagnosis not present

## 2024-05-27 DIAGNOSIS — M9901 Segmental and somatic dysfunction of cervical region: Secondary | ICD-10-CM | POA: Diagnosis not present

## 2024-05-27 DIAGNOSIS — M62830 Muscle spasm of back: Secondary | ICD-10-CM | POA: Diagnosis not present

## 2024-05-27 DIAGNOSIS — M9902 Segmental and somatic dysfunction of thoracic region: Secondary | ICD-10-CM | POA: Diagnosis not present

## 2024-05-29 DIAGNOSIS — M530 Cervicocranial syndrome: Secondary | ICD-10-CM | POA: Diagnosis not present

## 2024-05-29 DIAGNOSIS — M9901 Segmental and somatic dysfunction of cervical region: Secondary | ICD-10-CM | POA: Diagnosis not present

## 2024-05-29 DIAGNOSIS — S335XXA Sprain of ligaments of lumbar spine, initial encounter: Secondary | ICD-10-CM | POA: Diagnosis not present

## 2024-05-29 DIAGNOSIS — M9903 Segmental and somatic dysfunction of lumbar region: Secondary | ICD-10-CM | POA: Diagnosis not present

## 2024-05-29 DIAGNOSIS — M9902 Segmental and somatic dysfunction of thoracic region: Secondary | ICD-10-CM | POA: Diagnosis not present

## 2024-05-29 DIAGNOSIS — M62830 Muscle spasm of back: Secondary | ICD-10-CM | POA: Diagnosis not present

## 2024-06-01 NOTE — Progress Notes (Unsigned)
 Date:  06/02/2024   ID:  Jacob Mayo, DOB 1951/01/22, MRN [id number]  PCP:  Marvene Prentice SAUNDERS, FNP  Cardiologist:  NEW Electrophysiologist:  None   Chief Complaint:  Orthostatic Hypotension, coronary artery calcifications, HLD, dilated aorta  History of Present Illness:    Jacob Mayo is a 73 y.o. male with a hx of OSA not on CPAP, coronary artery calcifications with coronary calcium  score 1963 in 2022 with no ischemia on follow-up nuclear stress test, dilated ascending aorta at 44 mm on CT 07/25/2023 and HTN.  Had some issues after a total knee replacement with low blood pressures.  It was felt that his orthostasis was accentuated by his cardura , gabapentin  and pain meds.  His Gabapentin  was stopped and he weaned off narcotics.  He was encouraged to drink at least 64oz of fluids daily.  He was given a Rx for compression hose.   2D echo 12/23/2023 showed low normal LV function with EF 50 to 55% with mild LVH and normal diastolic function, normal RV, mild to moderate AI  and mildly dilated aortic root at 43mm.  He tried to go off Doxazosin  and could not do it due to recurrent urinary retention .  He is here for follow-up today and is doing well.  He denies any chest pain or pressure, SOB, DOE, PND, orthopnea, lower extremity edema, dizziness (except when bending over and standing up too fast), palpitations or syncope.  Past Medical History:  Diagnosis Date   Agatston coronary artery calcium  score greater than 400    Coronary Ca score 1963>>in all 3 coronary arteries and LM>>Lexiscan  myoview  12/2020 showed no ischemia   Allergy to sulfa  drugs 11/08/2021   Aortic atherosclerosis    Aortic insufficiency    mild to moderate by echo 12/2023   Arthritis    Ascending aorta dilatation    4.3cm by echo 12/2023   HLD (hyperlipidemia)    LDL goal < 70   Hyperkalemia 11/08/2021   Hypertension    Orthostatic hypotension    Sleep apnea    states this is no longer a problem since weight loss    Thoracic aortic aneurysm    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 Prednisone /Cytoxan   Past Surgical History:  Procedure Laterality Date   APPENDECTOMY     EYE SURGERY     GASTRIC BYPASS  2006   HERNIA REPAIR     KNEE SURGERY     LUNG BIOPSY  1997   TOTAL KNEE ARTHROPLASTY Left 09/06/2020   Procedure: TOTAL KNEE ARTHROPLASTY;  Surgeon: Melodi Lerner, MD;  Location: [facility];  Service: Orthopedics;  Laterality: Left;      Current Meds  Medication Sig   abatacept  (ORENCIA ) 250 MG injection Inject into the vein every 30 (thirty) days.   alendronate (FOSAMAX) 70 MG tablet Take 70 mg by mouth every Wednesday. Take with a full glass of water  on an empty stomach.   allopurinol  (ZYLOPRIM ) 100 MG tablet Take 200 mg by mouth daily.   aspirin  EC 81 MG tablet Take 1 tablet (81 mg total) by mouth daily. Swallow whole.   diphenhydramine -acetaminophen  (TYLENOL  PM) 25-500 MG TABS Take 1 tablet by mouth at bedtime as needed (pain).   doxazosin  (CARDURA ) 8 MG tablet Take 8 mg by mouth daily.   fish oil-omega-3 fatty acids 1000 MG capsule Take 2 g by mouth daily.    folic acid (FOLVITE) 1 MG tablet  Take by mouth.   loratadine (CLARITIN) 10 MG tablet 1 tablet Orally Once a day prn   losartan  (COZAAR ) 25 MG tablet Take 1 tablet (25 mg total) by mouth daily.   methotrexate 50 MG/2ML injection INJECT 0.8ML UNDER THE SKIN EVERY WEEK. DISCARD VIAL 28 DAYS AFTER FIRST USE for 84   Multiple Vitamin (MULTIVITAMIN) tablet Take 2 tablets by mouth daily.   Multiple Vitamins-Minerals (MULTI FOR HIM 50+) TABS 2 tablets Orally Once a day   rosuvastatin  (CRESTOR ) 5 MG tablet Take 5 mg by mouth daily.   sulfamethoxazole -trimethoprim  (BACTRIM ) 400-80 MG tablet Take 1 tablet by mouth 2 (two) times daily.   vitamin B-12 (CYANOCOBALAMIN) 1000 MCG tablet Take 1,000 mcg by mouth daily.     Allergies:   Ciprofloxacin   Social  History   Tobacco Use   Smoking status: Never   Smokeless tobacco: Never  Vaping Use   Vaping status: Never Used  Substance Use Topics   Alcohol use: Yes    Alcohol/week: 2.0 standard drinks of alcohol    Types: 2 Glasses of wine per week    Comment: occas.   Drug use: No     Family Hx: The patient's family history includes Cancer in his father.  ROS:   Please see the history of present illness.     All other systems reviewed and are negative.   Prior CV studies:   The following studies were reviewed today:  2D echo 11/2020 IMPRESSIONS   1. Left ventricular ejection fraction, by estimation, is 55 to 60%. The  left ventricle has normal function. The left ventricle has no regional  wall motion abnormalities. There is mild left ventricular hypertrophy.  Left ventricular diastolic parameters  were normal.   2. Right ventricular systolic function is normal. The right ventricular  size is normal. Tricuspid regurgitation signal is inadequate for assessing  PA pressure.   3. The mitral valve is normal in structure. No evidence of mitral valve  regurgitation.   4. The aortic valve was not well visualized. Aortic valve regurgitation  is mild. No aortic stenosis is present.   5. Aortic dilatation noted. There is dilatation of the aortic root,  measuring 43 mm.   Coronary Ca score 12/23/2020 FINDINGS: Coronary arteries: Normal origins.   Coronary Calcium  Score:   Left main: 271   Left anterior descending artery: 941   Left circumflex artery: 392   Right coronary artery: 359   Total: 1963   Percentile: 92nd   Pericardium: Normal.   Ascending Aorta: 4.5 cm, moderate dilation.   Non-cardiac: See separate report from [facility].   IMPRESSION: Coronary calcium  score of 1963 Agatston units. This was 92nd percentile for age-, race-, and sex-matched controls.  Lexiscan  myoxivew 01/2021 Study Highlights    Nuclear stress EF: 56%. The left ventricular  ejection fraction is normal (55-65%). There was no ST segment deviation noted during stress. No T wave inversion was noted during stress. This is a low risk study.   Negative stress tests for ischemia or infarction.  Ventricule is qualitatively dilated, but with normal volumes and TID. Low risk study.     Labs/Other Tests and Data Reviewed:   EKG Interpretation Date/Time:  Monday June 02 2024 11:18:58 EDT Ventricular Rate:  67 PR Interval:  210 QRS Duration:  92 QT Interval:  386 QTC Calculation: 407 R Axis:   25  Text Interpretation: Sinus rhythm with 1st degree A-V block When compared with ECG of 28-Feb-2023 13:51, No significant  change was found Confirmed by Shlomo Corning [phone]) on 06/02/2024 11:25:41 AM  EKG Interpretation Date/Time:  Monday June 02 2024 11:18:58 EDT Ventricular Rate:  67 PR Interval:  210 QRS Duration:  92 QT Interval:  386 QTC Calculation: 407 R Axis:   25  Text Interpretation: Sinus rhythm with 1st degree A-V block When compared with ECG of 28-Feb-2023 13:51, No significant change was found Confirmed by Shlomo Corning [phone]) on 06/02/2024 11:25:41 AM    Recent Labs: 07/09/2023: ALT 31; BUN 22; Creatinine, Ser 1.09; Potassium 5.2; Sodium 138   Recent Lipid Panel Lab Results  Component Value Date/Time   CHOL 130 07/09/2023 11:57 AM   TRIG 109 07/09/2023 11:57 AM   HDL 49 07/09/2023 11:57 AM   CHOLHDL 2.7 07/09/2023 11:57 AM   LDLCALC 61 07/09/2023 11:57 AM    Wt Readings from Last 3 Encounters:  06/02/24 204 lb (92.5 kg)  11/05/23 206 lb (93.4 kg)  02/28/23 196 lb 3.2 oz (89 kg)     Risk Assessment/Calculations:      Objective:    No data found.  Vital Signs:  BP 122/72   Pulse 67   Ht 5' 7 (1.702 m)   Wt 204 lb (92.5 kg)   SpO2 99%   BMI 31.95 kg/m   GEN: Well nourished, well developed in no acute distress HEENT: Normal NECK: No JVD; No carotid bruits LYMPHATICS: No lymphadenopathy CARDIAC:RRR, no  rubs, gallops 2/6  SM at LUSB RESPIRATORY:  Clear to auscultation without rales, wheezing or rhonchi  ABDOMEN: Soft, non-tender, non-distended MUSCULOSKELETAL:  No edema; No deformity  SKIN: Warm and dry NEUROLOGIC:  Alert and oriented x 3 PSYCHIATRIC:  Normal affect  ASSESSMENT & PLAN:    Orthostatic Hypotension -This is been going on for years -likely related to orthostatic hypotension accentuated by pain meds and muscle relaxants  -He has been off and on Cardura  but has had to stay on Cardura  because of urinary retention  -he is now off gabapentin  and pain meds -2D echo showed low normal LVF EF 50-55% -his dizziness has significantly improved and no presyncope or syncope -He cannot get compression hose on so does not use those  -Encouraged him to continue drinking at least 64 ounces of fluid a day and avoid caffeine   HTN -BP controlled on exam today -Continue losartan  25 mg daily with as needed refills -I have personally reviewed and interpreted outside labs performed by patient's PCP which showed SCr 1, K+ 4.9  Aortic insufficiency - mild by echo 2022 - Mild to moderate by echo 12/19/2023 - Repeat echo 12/2024  Dilated aortic root and ascending aorta -aortic root measured at 43mm by echo 11/2020 -ascending aorta measured at 44 mm by Chest CTA in 2024 and echo 12/2022 -43 mm by echo 12/19/2023 -Repeat chest CTA 12/2024  Coronary artery calcium  -Coronary calcium  score on 12/23/2020 was high at 1963 in all vessels including LM -Lexiscan  myoview  01/2021 showed no ischemia -He has not had any anginal symptoms -Continue aspirin  81 mg daily, Crestor  5 mg daily with as needed  HLD - LDL goal < 70  - Check FLP and ALT - Continue Crestor  5 mg daily  Medication Adjustments/Labs and Tests Ordered: Current medicines are reviewed at length with the patient today.  Concerns regarding medicines are outlined above.   Tests Ordered: Orders Placed This Encounter  Procedures   EKG 12-Lead    Medication  Changes: No orders of the defined types were placed in this encounter.  Follow Up:  In [place] year  Signed, Wilbert Bihari, MD  06/02/2024 11:27 AM    [facility]

## 2024-06-02 ENCOUNTER — Ambulatory Visit: Attending: Cardiology | Admitting: Cardiology

## 2024-06-02 ENCOUNTER — Encounter: Payer: Self-pay | Admitting: Cardiology

## 2024-06-02 VITALS — BP 122/72 | HR 67 | Ht 67.0 in | Wt 204.0 lb

## 2024-06-02 DIAGNOSIS — Z79899 Other long term (current) drug therapy: Secondary | ICD-10-CM

## 2024-06-02 DIAGNOSIS — I951 Orthostatic hypotension: Secondary | ICD-10-CM

## 2024-06-02 DIAGNOSIS — I351 Nonrheumatic aortic (valve) insufficiency: Secondary | ICD-10-CM | POA: Diagnosis not present

## 2024-06-02 DIAGNOSIS — I2510 Atherosclerotic heart disease of native coronary artery without angina pectoris: Secondary | ICD-10-CM | POA: Diagnosis not present

## 2024-06-02 DIAGNOSIS — E785 Hyperlipidemia, unspecified: Secondary | ICD-10-CM

## 2024-06-02 DIAGNOSIS — I712 Thoracic aortic aneurysm, without rupture, unspecified: Secondary | ICD-10-CM | POA: Diagnosis not present

## 2024-06-02 DIAGNOSIS — I10 Essential (primary) hypertension: Secondary | ICD-10-CM | POA: Diagnosis not present

## 2024-06-02 DIAGNOSIS — S335XXA Sprain of ligaments of lumbar spine, initial encounter: Secondary | ICD-10-CM | POA: Diagnosis not present

## 2024-06-02 DIAGNOSIS — M9901 Segmental and somatic dysfunction of cervical region: Secondary | ICD-10-CM | POA: Diagnosis not present

## 2024-06-02 DIAGNOSIS — I77810 Thoracic aortic ectasia: Secondary | ICD-10-CM

## 2024-06-02 DIAGNOSIS — M530 Cervicocranial syndrome: Secondary | ICD-10-CM | POA: Diagnosis not present

## 2024-06-02 DIAGNOSIS — M9902 Segmental and somatic dysfunction of thoracic region: Secondary | ICD-10-CM | POA: Diagnosis not present

## 2024-06-02 DIAGNOSIS — M62830 Muscle spasm of back: Secondary | ICD-10-CM | POA: Diagnosis not present

## 2024-06-02 DIAGNOSIS — M9903 Segmental and somatic dysfunction of lumbar region: Secondary | ICD-10-CM | POA: Diagnosis not present

## 2024-06-02 LAB — LIPID PANEL

## 2024-06-02 NOTE — Patient Instructions (Addendum)
 Medication Instructions:  Your physician recommends that you continue on your current medications as directed. Please refer to the Current Medication list given to you today.  *If you need a refill on your cardiac medications before your next appointment, please call your pharmacy*  Lab Work: Please complete a FASTING lipid panel and ALT in our first floor lab before you leave today.  If you have labs (blood work) drawn today and your tests are completely normal, you will receive your results only by: MyChart Message (if you have MyChart) OR A paper copy in the mail If you have any lab test that is abnormal or we need to change your treatment, we will call you to review the results.  Testing/Procedures: Your physician has requested that you have an echocardiogram in April 2026. Echocardiography is a painless test that uses sound waves to create images of your heart. It provides your doctor with information about the size and shape of your heart and how well your heart's chambers and valves are working. This procedure takes approximately one hour. There are no restrictions for this procedure. Please do NOT wear cologne, perfume, aftershave, or lotions (deodorant is allowed). Please arrive 15 minutes prior to your appointment time.  Please note: We ask at that you not bring children with you during ultrasound (echo/ vascular) testing. Due to room size and safety concerns, children are not allowed in the ultrasound rooms during exams. Our front office staff cannot provide observation of children in our lobby area while testing is being conducted. An adult accompanying a patient to their appointment will only be allowed in the ultrasound room at the discretion of the ultrasound technician under special circumstances. We apologize for any inconvenience.    Your cardiac CT will be scheduled in April 2026 at:    [facility] [address]  [place], [place]  [address] [phone]   If scheduled at the Heart and Vascular Tower at [facility] [address], please enter the parking lot using the [address] entrance and use the FREE valet service at the patient drop-off area. Enter the building and check-in with registration on the main floor.   Please follow these instructions carefully (unless otherwise directed):   On the Night Before the Test: Be sure to Drink plenty of water .  On the Day of the Test: Drink plenty of water  until 1 hour prior to the test. Do not eat any food 1 hour prior to test.        After the Test: Drink plenty of water . After receiving IV contrast, you may experience a mild flushed feeling. This is normal. On occasion, you may experience a mild rash up to 24 hours after the test. This is not dangerous. If this occurs, you can take Benadryl  25 mg, Zyrtec, Claritin, or Allegra and increase your fluid intake. (Patients taking Tikosyn should avoid Benadryl , and may take Zyrtec, Claritin, or Allegra) If you experience trouble breathing, this can be serious. If it is severe call 911 IMMEDIATELY. If it is mild, please call our office.  We will call to schedule your test 2-4 weeks out understanding that some insurance companies will need an authorization prior to the service being performed.   For more information and frequently asked questions, please visit our website : [email or web address]  For non-scheduling related questions, please contact the cardiac imaging nurse navigator should you have any questions/concerns: Cardiac Imaging Nurse Navigators Direct Office Dial: [phone]   For scheduling needs, including cancellations  and rescheduling, please call [place], [phone].    Follow-Up: At [facility], you and your health needs are our priority.  As part of our continuing mission to provide you with exceptional heart care, our providers are all part of one team.  This team includes your  primary Cardiologist (physician) and Advanced Practice Providers or APPs (Physician Assistants and Nurse Practitioners) who all work together to provide you with the care you need, when you need it.  Your next appointment:   1 year(s)  Provider:   Wilbert Bihari, MD

## 2024-06-03 ENCOUNTER — Ambulatory Visit: Payer: Self-pay | Admitting: Cardiology

## 2024-06-03 LAB — LIPID PANEL
Cholesterol, Total: 122 mg/dL (ref 100–199)
HDL: 41 mg/dL (ref >39)
LDL CALC COMMENT:: 3 ratio (ref 0.0–5.0)
LDL Chol Calc (NIH): 64 mg/dL (ref 0–99)
Triglycerides: 84 mg/dL (ref 0–149)
VLDL Cholesterol Cal: 17 mg/dL (ref 5–40)

## 2024-06-03 LAB — ALT: ALT: 26 IU/L (ref 0–44)

## 2024-06-05 DIAGNOSIS — S335XXA Sprain of ligaments of lumbar spine, initial encounter: Secondary | ICD-10-CM | POA: Diagnosis not present

## 2024-06-05 DIAGNOSIS — M9902 Segmental and somatic dysfunction of thoracic region: Secondary | ICD-10-CM | POA: Diagnosis not present

## 2024-06-05 DIAGNOSIS — M9903 Segmental and somatic dysfunction of lumbar region: Secondary | ICD-10-CM | POA: Diagnosis not present

## 2024-06-05 DIAGNOSIS — M62830 Muscle spasm of back: Secondary | ICD-10-CM | POA: Diagnosis not present

## 2024-06-05 DIAGNOSIS — M9901 Segmental and somatic dysfunction of cervical region: Secondary | ICD-10-CM | POA: Diagnosis not present

## 2024-06-05 DIAGNOSIS — M530 Cervicocranial syndrome: Secondary | ICD-10-CM | POA: Diagnosis not present

## 2024-06-09 DIAGNOSIS — M9901 Segmental and somatic dysfunction of cervical region: Secondary | ICD-10-CM | POA: Diagnosis not present

## 2024-06-09 DIAGNOSIS — M9903 Segmental and somatic dysfunction of lumbar region: Secondary | ICD-10-CM | POA: Diagnosis not present

## 2024-06-09 DIAGNOSIS — M62830 Muscle spasm of back: Secondary | ICD-10-CM | POA: Diagnosis not present

## 2024-06-09 DIAGNOSIS — M9902 Segmental and somatic dysfunction of thoracic region: Secondary | ICD-10-CM | POA: Diagnosis not present

## 2024-06-09 DIAGNOSIS — S335XXA Sprain of ligaments of lumbar spine, initial encounter: Secondary | ICD-10-CM | POA: Diagnosis not present

## 2024-06-09 DIAGNOSIS — M0609 Rheumatoid arthritis without rheumatoid factor, multiple sites: Secondary | ICD-10-CM | POA: Diagnosis not present

## 2024-06-09 DIAGNOSIS — M530 Cervicocranial syndrome: Secondary | ICD-10-CM | POA: Diagnosis not present

## 2024-06-11 DIAGNOSIS — M530 Cervicocranial syndrome: Secondary | ICD-10-CM | POA: Diagnosis not present

## 2024-06-11 DIAGNOSIS — M9901 Segmental and somatic dysfunction of cervical region: Secondary | ICD-10-CM | POA: Diagnosis not present

## 2024-06-11 DIAGNOSIS — M9903 Segmental and somatic dysfunction of lumbar region: Secondary | ICD-10-CM | POA: Diagnosis not present

## 2024-06-11 DIAGNOSIS — S335XXA Sprain of ligaments of lumbar spine, initial encounter: Secondary | ICD-10-CM | POA: Diagnosis not present

## 2024-06-11 DIAGNOSIS — M9902 Segmental and somatic dysfunction of thoracic region: Secondary | ICD-10-CM | POA: Diagnosis not present

## 2024-06-11 DIAGNOSIS — M62830 Muscle spasm of back: Secondary | ICD-10-CM | POA: Diagnosis not present

## 2024-06-16 DIAGNOSIS — M62830 Muscle spasm of back: Secondary | ICD-10-CM | POA: Diagnosis not present

## 2024-06-16 DIAGNOSIS — M9903 Segmental and somatic dysfunction of lumbar region: Secondary | ICD-10-CM | POA: Diagnosis not present

## 2024-06-16 DIAGNOSIS — M9902 Segmental and somatic dysfunction of thoracic region: Secondary | ICD-10-CM | POA: Diagnosis not present

## 2024-06-16 DIAGNOSIS — M9901 Segmental and somatic dysfunction of cervical region: Secondary | ICD-10-CM | POA: Diagnosis not present

## 2024-06-16 DIAGNOSIS — M530 Cervicocranial syndrome: Secondary | ICD-10-CM | POA: Diagnosis not present

## 2024-06-16 DIAGNOSIS — S335XXA Sprain of ligaments of lumbar spine, initial encounter: Secondary | ICD-10-CM | POA: Diagnosis not present

## 2024-06-19 DIAGNOSIS — M530 Cervicocranial syndrome: Secondary | ICD-10-CM | POA: Diagnosis not present

## 2024-06-19 DIAGNOSIS — M9901 Segmental and somatic dysfunction of cervical region: Secondary | ICD-10-CM | POA: Diagnosis not present

## 2024-06-19 DIAGNOSIS — S335XXA Sprain of ligaments of lumbar spine, initial encounter: Secondary | ICD-10-CM | POA: Diagnosis not present

## 2024-06-19 DIAGNOSIS — M62830 Muscle spasm of back: Secondary | ICD-10-CM | POA: Diagnosis not present

## 2024-06-19 DIAGNOSIS — M9902 Segmental and somatic dysfunction of thoracic region: Secondary | ICD-10-CM | POA: Diagnosis not present

## 2024-06-19 DIAGNOSIS — M9903 Segmental and somatic dysfunction of lumbar region: Secondary | ICD-10-CM | POA: Diagnosis not present

## 2024-06-23 DIAGNOSIS — M62830 Muscle spasm of back: Secondary | ICD-10-CM | POA: Diagnosis not present

## 2024-06-23 DIAGNOSIS — M9903 Segmental and somatic dysfunction of lumbar region: Secondary | ICD-10-CM | POA: Diagnosis not present

## 2024-06-23 DIAGNOSIS — M9901 Segmental and somatic dysfunction of cervical region: Secondary | ICD-10-CM | POA: Diagnosis not present

## 2024-06-23 DIAGNOSIS — M9902 Segmental and somatic dysfunction of thoracic region: Secondary | ICD-10-CM | POA: Diagnosis not present

## 2024-06-23 DIAGNOSIS — S335XXA Sprain of ligaments of lumbar spine, initial encounter: Secondary | ICD-10-CM | POA: Diagnosis not present

## 2024-06-23 DIAGNOSIS — M530 Cervicocranial syndrome: Secondary | ICD-10-CM | POA: Diagnosis not present

## 2024-06-30 DIAGNOSIS — M9902 Segmental and somatic dysfunction of thoracic region: Secondary | ICD-10-CM | POA: Diagnosis not present

## 2024-06-30 DIAGNOSIS — M62830 Muscle spasm of back: Secondary | ICD-10-CM | POA: Diagnosis not present

## 2024-06-30 DIAGNOSIS — S335XXA Sprain of ligaments of lumbar spine, initial encounter: Secondary | ICD-10-CM | POA: Diagnosis not present

## 2024-06-30 DIAGNOSIS — M9903 Segmental and somatic dysfunction of lumbar region: Secondary | ICD-10-CM | POA: Diagnosis not present

## 2024-06-30 DIAGNOSIS — M530 Cervicocranial syndrome: Secondary | ICD-10-CM | POA: Diagnosis not present

## 2024-06-30 DIAGNOSIS — M9901 Segmental and somatic dysfunction of cervical region: Secondary | ICD-10-CM | POA: Diagnosis not present

## 2024-07-07 DIAGNOSIS — S335XXA Sprain of ligaments of lumbar spine, initial encounter: Secondary | ICD-10-CM | POA: Diagnosis not present

## 2024-07-07 DIAGNOSIS — M9902 Segmental and somatic dysfunction of thoracic region: Secondary | ICD-10-CM | POA: Diagnosis not present

## 2024-07-07 DIAGNOSIS — M530 Cervicocranial syndrome: Secondary | ICD-10-CM | POA: Diagnosis not present

## 2024-07-07 DIAGNOSIS — M0609 Rheumatoid arthritis without rheumatoid factor, multiple sites: Secondary | ICD-10-CM | POA: Diagnosis not present

## 2024-07-07 DIAGNOSIS — M62830 Muscle spasm of back: Secondary | ICD-10-CM | POA: Diagnosis not present

## 2024-07-07 DIAGNOSIS — M9903 Segmental and somatic dysfunction of lumbar region: Secondary | ICD-10-CM | POA: Diagnosis not present

## 2024-07-07 DIAGNOSIS — M9901 Segmental and somatic dysfunction of cervical region: Secondary | ICD-10-CM | POA: Diagnosis not present

## 2024-07-10 NOTE — Telephone Encounter (Signed)
 Patient says he is returning a call regarding this matter.

## 2024-07-14 DIAGNOSIS — M9902 Segmental and somatic dysfunction of thoracic region: Secondary | ICD-10-CM | POA: Diagnosis not present

## 2024-07-14 DIAGNOSIS — M9903 Segmental and somatic dysfunction of lumbar region: Secondary | ICD-10-CM | POA: Diagnosis not present

## 2024-07-14 DIAGNOSIS — M530 Cervicocranial syndrome: Secondary | ICD-10-CM | POA: Diagnosis not present

## 2024-07-14 DIAGNOSIS — M9901 Segmental and somatic dysfunction of cervical region: Secondary | ICD-10-CM | POA: Diagnosis not present

## 2024-07-14 DIAGNOSIS — M62830 Muscle spasm of back: Secondary | ICD-10-CM | POA: Diagnosis not present

## 2024-07-14 DIAGNOSIS — S335XXA Sprain of ligaments of lumbar spine, initial encounter: Secondary | ICD-10-CM | POA: Diagnosis not present

## 2024-07-16 DIAGNOSIS — M1A09X0 Idiopathic chronic gout, multiple sites, without tophus (tophi): Secondary | ICD-10-CM | POA: Diagnosis not present

## 2024-07-16 DIAGNOSIS — M1991 Primary osteoarthritis, unspecified site: Secondary | ICD-10-CM | POA: Diagnosis not present

## 2024-07-16 DIAGNOSIS — M79642 Pain in left hand: Secondary | ICD-10-CM | POA: Diagnosis not present

## 2024-07-16 DIAGNOSIS — B5800 Toxoplasma oculopathy, unspecified: Secondary | ICD-10-CM | POA: Diagnosis not present

## 2024-07-16 DIAGNOSIS — M0609 Rheumatoid arthritis without rheumatoid factor, multiple sites: Secondary | ICD-10-CM | POA: Diagnosis not present

## 2024-07-16 DIAGNOSIS — M8580 Other specified disorders of bone density and structure, unspecified site: Secondary | ICD-10-CM | POA: Diagnosis not present

## 2024-07-16 DIAGNOSIS — R7989 Other specified abnormal findings of blood chemistry: Secondary | ICD-10-CM | POA: Diagnosis not present

## 2024-07-16 DIAGNOSIS — M25562 Pain in left knee: Secondary | ICD-10-CM | POA: Diagnosis not present

## 2024-07-16 DIAGNOSIS — M3130 Wegener's granulomatosis without renal involvement: Secondary | ICD-10-CM | POA: Diagnosis not present

## 2024-07-21 DIAGNOSIS — M9902 Segmental and somatic dysfunction of thoracic region: Secondary | ICD-10-CM | POA: Diagnosis not present

## 2024-07-21 DIAGNOSIS — S335XXA Sprain of ligaments of lumbar spine, initial encounter: Secondary | ICD-10-CM | POA: Diagnosis not present

## 2024-07-21 DIAGNOSIS — M9903 Segmental and somatic dysfunction of lumbar region: Secondary | ICD-10-CM | POA: Diagnosis not present

## 2024-07-21 DIAGNOSIS — M62830 Muscle spasm of back: Secondary | ICD-10-CM | POA: Diagnosis not present

## 2024-07-21 DIAGNOSIS — M9901 Segmental and somatic dysfunction of cervical region: Secondary | ICD-10-CM | POA: Diagnosis not present

## 2024-07-21 DIAGNOSIS — M530 Cervicocranial syndrome: Secondary | ICD-10-CM | POA: Diagnosis not present

## 2024-08-04 DIAGNOSIS — M62830 Muscle spasm of back: Secondary | ICD-10-CM | POA: Diagnosis not present

## 2024-08-04 DIAGNOSIS — M9903 Segmental and somatic dysfunction of lumbar region: Secondary | ICD-10-CM | POA: Diagnosis not present

## 2024-08-04 DIAGNOSIS — M530 Cervicocranial syndrome: Secondary | ICD-10-CM | POA: Diagnosis not present

## 2024-08-04 DIAGNOSIS — M0609 Rheumatoid arthritis without rheumatoid factor, multiple sites: Secondary | ICD-10-CM | POA: Diagnosis not present

## 2024-08-04 DIAGNOSIS — S335XXA Sprain of ligaments of lumbar spine, initial encounter: Secondary | ICD-10-CM | POA: Diagnosis not present

## 2024-08-04 DIAGNOSIS — M9901 Segmental and somatic dysfunction of cervical region: Secondary | ICD-10-CM | POA: Diagnosis not present

## 2024-08-04 DIAGNOSIS — M9902 Segmental and somatic dysfunction of thoracic region: Secondary | ICD-10-CM | POA: Diagnosis not present

## 2024-08-06 DIAGNOSIS — R809 Proteinuria, unspecified: Secondary | ICD-10-CM | POA: Diagnosis not present

## 2024-08-06 DIAGNOSIS — N1831 Chronic kidney disease, stage 3a: Secondary | ICD-10-CM | POA: Diagnosis not present

## 2024-08-06 DIAGNOSIS — E782 Mixed hyperlipidemia: Secondary | ICD-10-CM | POA: Diagnosis not present

## 2024-08-06 DIAGNOSIS — M109 Gout, unspecified: Secondary | ICD-10-CM | POA: Diagnosis not present

## 2024-08-06 DIAGNOSIS — M8580 Other specified disorders of bone density and structure, unspecified site: Secondary | ICD-10-CM | POA: Diagnosis not present

## 2024-08-06 DIAGNOSIS — D84821 Immunodeficiency due to drugs: Secondary | ICD-10-CM | POA: Diagnosis not present

## 2024-08-06 DIAGNOSIS — J309 Allergic rhinitis, unspecified: Secondary | ICD-10-CM | POA: Diagnosis not present

## 2024-08-06 DIAGNOSIS — E669 Obesity, unspecified: Secondary | ICD-10-CM | POA: Diagnosis not present

## 2024-08-06 DIAGNOSIS — I10 Essential (primary) hypertension: Secondary | ICD-10-CM | POA: Diagnosis not present

## 2024-08-27 ENCOUNTER — Other Ambulatory Visit: Payer: Self-pay | Admitting: Cardiology

## 2024-11-04 ENCOUNTER — Ambulatory Visit: Admitting: Infectious Disease

## 2024-12-16 ENCOUNTER — Other Ambulatory Visit (HOSPITAL_COMMUNITY)
